# Patient Record
Sex: Female | Born: 1960
Health system: Southern US, Community
[De-identification: ages and names within clinical notes are randomized; demographics above are authoritative.]

## PROBLEM LIST (undated history)

## (undated) DIAGNOSIS — K760 Fatty (change of) liver, not elsewhere classified: Secondary | ICD-10-CM

## (undated) DIAGNOSIS — J189 Pneumonia, unspecified organism: Secondary | ICD-10-CM

## (undated) DIAGNOSIS — K589 Irritable bowel syndrome without diarrhea: Secondary | ICD-10-CM

## (undated) DIAGNOSIS — K8020 Calculus of gallbladder without cholecystitis without obstruction: Secondary | ICD-10-CM

## (undated) DIAGNOSIS — Z8619 Personal history of other infectious and parasitic diseases: Secondary | ICD-10-CM

## (undated) DIAGNOSIS — E119 Type 2 diabetes mellitus without complications: Secondary | ICD-10-CM

## (undated) DIAGNOSIS — K219 Gastro-esophageal reflux disease without esophagitis: Secondary | ICD-10-CM

## (undated) DIAGNOSIS — T7840XA Allergy, unspecified, initial encounter: Secondary | ICD-10-CM

## (undated) DIAGNOSIS — I10 Essential (primary) hypertension: Secondary | ICD-10-CM

## (undated) DIAGNOSIS — F419 Anxiety disorder, unspecified: Secondary | ICD-10-CM

## (undated) DIAGNOSIS — E785 Hyperlipidemia, unspecified: Secondary | ICD-10-CM

## (undated) DIAGNOSIS — N814 Uterovaginal prolapse, unspecified: Secondary | ICD-10-CM

## (undated) DIAGNOSIS — M1990 Unspecified osteoarthritis, unspecified site: Secondary | ICD-10-CM

## (undated) HISTORY — DX: Hyperlipidemia, unspecified: E78.5

## (undated) HISTORY — DX: Gastro-esophageal reflux disease without esophagitis: K21.9

## (undated) HISTORY — DX: Type 2 diabetes mellitus without complications: E11.9

## (undated) HISTORY — DX: Personal history of other infectious and parasitic diseases: Z86.19

## (undated) HISTORY — PX: ABDOMINAL HYSTERECTOMY: SHX81

## (undated) HISTORY — DX: Essential (primary) hypertension: I10

## (undated) HISTORY — DX: Irritable bowel syndrome, unspecified: K58.9

## (undated) HISTORY — PX: COLONOSCOPY: SHX174

## (undated) HISTORY — PX: CHOLECYSTECTOMY: SHX55

## (undated) HISTORY — DX: Fatty (change of) liver, not elsewhere classified: K76.0

## (undated) HISTORY — DX: Calculus of gallbladder without cholecystitis without obstruction: K80.20

## (undated) HISTORY — DX: Anxiety disorder, unspecified: F41.9

## (undated) HISTORY — DX: Uterovaginal prolapse, unspecified: N81.4

## (undated) HISTORY — DX: Allergy, unspecified, initial encounter: T78.40XA

## (undated) HISTORY — DX: Unspecified osteoarthritis, unspecified site: M19.90

## (undated) HISTORY — PX: BREAST SURGERY: SHX581

## (undated) HISTORY — DX: Pneumonia, unspecified organism: J18.9

---
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
## 2018-07-17 LAB — LIPID PANEL
Cholesterol: 178 (ref 0–200)
HDL: 46 (ref 35–70)
LDL Cholesterol: 104
Triglycerides: 167 — AB (ref 40–160)

## 2018-07-17 LAB — COMPREHENSIVE METABOLIC PANEL
Albumin: 4.2 (ref 3.5–5.0)
Calcium: 9.5 (ref 8.7–10.7)
GFR calc non Af Amer: 100
Globulin: 2.7

## 2018-07-17 LAB — HEMOGLOBIN A1C: Hemoglobin A1C: 10.8

## 2018-07-17 LAB — BASIC METABOLIC PANEL
BUN: 9 (ref 4–21)
CO2: 27 — AB (ref 13–22)
Chloride: 103 (ref 99–108)
Creatinine: 0.6 (ref 0.5–1.1)
Glucose: 152
Potassium: 4.5 (ref 3.4–5.3)
Sodium: 139 (ref 137–147)

## 2018-07-17 LAB — CBC AND DIFFERENTIAL
HCT: 39 (ref 36–46)
Hemoglobin: 13.5 (ref 12.0–16.0)
Neutrophils Absolute: 3873
Platelets: 230 (ref 150–399)
WBC: 7.7

## 2018-07-17 LAB — HEPATIC FUNCTION PANEL
ALT: 46 — AB (ref 7–35)
AST: 32 (ref 13–35)
Alkaline Phosphatase: 62 (ref 25–125)
Bilirubin, Total: 0.5

## 2018-07-17 LAB — CBC: RBC: 4.36 (ref 3.87–5.11)

## 2019-01-19 ENCOUNTER — Other Ambulatory Visit: Payer: Self-pay | Admitting: Obstetrics and Gynecology

## 2019-01-19 DIAGNOSIS — N6452 Nipple discharge: Secondary | ICD-10-CM | POA: Diagnosis not present

## 2019-01-19 DIAGNOSIS — Z01419 Encounter for gynecological examination (general) (routine) without abnormal findings: Secondary | ICD-10-CM | POA: Diagnosis not present

## 2019-01-19 DIAGNOSIS — Z6834 Body mass index (BMI) 34.0-34.9, adult: Secondary | ICD-10-CM | POA: Diagnosis not present

## 2019-01-19 LAB — HM PAP SMEAR: HM Pap smear: NEGATIVE

## 2019-01-26 ENCOUNTER — Other Ambulatory Visit: Payer: Self-pay

## 2019-01-26 ENCOUNTER — Ambulatory Visit
Admission: RE | Admit: 2019-01-26 | Discharge: 2019-01-26 | Disposition: A | Discharge status: Home / Self Care | Payer: PRIVATE HEALTH INSURANCE | Source: Ambulatory Visit | Attending: Obstetrics and Gynecology | Admitting: Obstetrics and Gynecology

## 2019-01-26 ENCOUNTER — Other Ambulatory Visit: Payer: Self-pay | Admitting: Obstetrics and Gynecology

## 2019-01-26 ENCOUNTER — Ambulatory Visit
Admission: RE | Admit: 2019-01-26 | Discharge: 2019-01-26 | Disposition: A | Discharge status: Home / Self Care | Payer: Self-pay | Source: Ambulatory Visit | Attending: Obstetrics and Gynecology | Admitting: Obstetrics and Gynecology

## 2019-01-26 DIAGNOSIS — N6452 Nipple discharge: Secondary | ICD-10-CM

## 2019-01-26 LAB — HM MAMMOGRAPHY

## 2019-01-27 DIAGNOSIS — R102 Pelvic and perineal pain: Secondary | ICD-10-CM | POA: Diagnosis not present

## 2019-01-28 ENCOUNTER — Other Ambulatory Visit: Payer: Self-pay

## 2019-01-28 ENCOUNTER — Ambulatory Visit
Admission: RE | Admit: 2019-01-28 | Discharge: 2019-01-28 | Disposition: A | Discharge status: Home / Self Care | Payer: BC Managed Care – PPO | Source: Ambulatory Visit | Attending: Obstetrics and Gynecology | Admitting: Obstetrics and Gynecology

## 2019-01-28 ENCOUNTER — Other Ambulatory Visit (HOSPITAL_COMMUNITY): Payer: Self-pay | Admitting: Diagnostic Radiology

## 2019-01-28 DIAGNOSIS — N6341 Unspecified lump in right breast, subareolar: Secondary | ICD-10-CM | POA: Diagnosis not present

## 2019-01-28 DIAGNOSIS — N6311 Unspecified lump in the right breast, upper outer quadrant: Secondary | ICD-10-CM | POA: Diagnosis not present

## 2019-01-28 DIAGNOSIS — N6452 Nipple discharge: Secondary | ICD-10-CM

## 2019-01-28 DIAGNOSIS — N62 Hypertrophy of breast: Secondary | ICD-10-CM | POA: Diagnosis not present

## 2019-02-08 ENCOUNTER — Encounter: Payer: Self-pay | Admitting: General Surgery

## 2019-02-09 ENCOUNTER — Other Ambulatory Visit: Payer: Self-pay | Admitting: General Surgery

## 2019-02-09 DIAGNOSIS — Z803 Family history of malignant neoplasm of breast: Secondary | ICD-10-CM | POA: Diagnosis not present

## 2019-02-09 DIAGNOSIS — D241 Benign neoplasm of right breast: Secondary | ICD-10-CM

## 2019-02-09 DIAGNOSIS — N6452 Nipple discharge: Secondary | ICD-10-CM | POA: Diagnosis not present

## 2019-02-09 DIAGNOSIS — R739 Hyperglycemia, unspecified: Secondary | ICD-10-CM | POA: Diagnosis not present

## 2019-02-11 ENCOUNTER — Other Ambulatory Visit: Payer: Self-pay | Admitting: General Surgery

## 2019-02-11 DIAGNOSIS — D241 Benign neoplasm of right breast: Secondary | ICD-10-CM

## 2019-03-08 ENCOUNTER — Encounter (HOSPITAL_BASED_OUTPATIENT_CLINIC_OR_DEPARTMENT_OTHER): Payer: Self-pay

## 2019-03-08 ENCOUNTER — Other Ambulatory Visit: Payer: Self-pay

## 2019-03-12 ENCOUNTER — Other Ambulatory Visit (HOSPITAL_COMMUNITY)
Admission: RE | Admit: 2019-03-12 | Discharge: 2019-03-12 | Disposition: A | Discharge status: Home / Self Care | Payer: BC Managed Care – PPO | Source: Ambulatory Visit | Attending: General Surgery | Admitting: General Surgery

## 2019-03-12 DIAGNOSIS — Z01812 Encounter for preprocedural laboratory examination: Secondary | ICD-10-CM | POA: Insufficient documentation

## 2019-03-12 DIAGNOSIS — Z20828 Contact with and (suspected) exposure to other viral communicable diseases: Secondary | ICD-10-CM | POA: Insufficient documentation

## 2019-03-13 LAB — NOVEL CORONAVIRUS, NAA (HOSP ORDER, SEND-OUT TO REF LAB; TAT 18-24 HRS): SARS-CoV-2, NAA: NOT DETECTED

## 2019-03-13 NOTE — H&P (Signed)
[name]: [facility] Patient #: [id number] DOB: 1960/11/02 Married / Language: English / Race: White Female      History of Present Illness      This is a 58 year old female, referred by Dr. Margarette Canada at the [facility] for intraductal papilloma right breast and bloody nipple discharge. Allison Dillon is her gynecologist. She is going to establish with [facility] on [facility] [employer] in November.      She has no prior breast problems. She developed a spontaneous, unilateral right nipple discharge that was bloody about 4-5 weeks ago. Imaging studies showed a small ductal mass on the right side, 11 o'clock position, subareolar area. Axillary ultrasound negative. He was guided biopsy shows papillary lesion and usual ductal hyperplasia. Excision recommended.     Comorbidities include hyperglycemia. She's never been evaluated for this. She does not take any medicines. She says her glucose runs around 200 or a little bit higher. She eats a lot of carbohydrates it sounds like. She no she needs a primary care physician and was going to have to wait about 2 months for that. We talked about this for a long time. I told her to eliminate all carbohydrates from her diet than just eat protein and fruits and vegetables. That will increase the likelihood that her blood sugar is reasonably well controlled at the time of her breast biopsy. Family history reveals  maternal aunt has breast cancer and has metastatic disease. No other cancers in the family Social history reveals she moved to [place] about 2 years ago. Her husband now works as a Sport and exercise psychologist. She is unemployed. Denies alcohol or tobacco. Married with 4 children.      I have advised her to undergo right breast lumpectomy with radioactive seed localization. She agrees with this plan. I did draw pictures for her. I discussed the indications, details, techniques and  numerous risk of the surgery with her. She is aware of the risk of bleeding, infection, cosmetic deformity, reoperation for cancer, flattening of the nipple, insensate nipple. And other unforeseen problems. She understands all these issues. All of her questions were answered. She agrees with this plan.   Allergies  Penicillins  Hives. Iodine (Antiseptic) *ANTISEPTICS & DISINFECTANTS*  Swelling. Iodine Contrast Allergies Reconciled   Medication History Multi Vitamin (Oral) Active. Vitamin D3 (Oral) Specific strength unknown - Active. Vitamin B12 (Oral) Specific strength unknown - Active. Amla Supplement Active. Medications Reconciled  Vitals  Weight: 198 lb Height: 63.75in Body Surface Area: 1.94 m Body Mass Index: 34.25 kg/m  Temp.: 98.27F(Temporal)  Pulse: 120 (Regular)  P.OX: 97% (Room air) BP: 138/80 (Sitting, Left Arm, Standard)     Physical Exam General Mental Status-Alert. General Appearance-Consistent with stated age. Hydration-Well hydrated. Voice-Normal. Note: BMI 34  Head and Neck Head-normocephalic, atraumatic with no lesions or palpable masses. Trachea-midline. Thyroid Gland Characteristics - normal size and consistency.  Eye Eyeball - Bilateral-Extraocular movements intact. Sclera/Conjunctiva - Bilateral-No scleral icterus.  Chest and Lung Exam Chest and lung exam reveals -quiet, even and easy respiratory effort with no use of accessory muscles and on auscultation, normal breath sounds, no adventitious sounds and normal vocal resonance. Inspection Chest Wall - Normal. Back - normal.  Breast Note: Breasts are moderately large. Symmetrical. Both nipples and areola look normal. I cannot elicit a discharge on either side. There is no axillary adenopathy. No hematoma.  Cardiovascular Cardiovascular examination reveals -normal heart sounds, regular rate and rhythm with  no murmurs and normal pedal pulses  bilaterally.  Abdomen Inspection Inspection of the abdomen reveals - No Hernias. Skin - Scar - no surgical scars. Palpation/Percussion Palpation and Percussion of the abdomen reveal - Soft, Non Tender, No Rebound tenderness, No Rigidity (guarding) and No hepatosplenomegaly. Auscultation Auscultation of the abdomen reveals - Bowel sounds normal.  Neurologic Neurologic evaluation reveals -alert and oriented x 3 with no impairment of recent or remote memory. Mental Status-Normal.  Musculoskeletal Normal Exam - Left-Upper Extremity Strength Normal and Lower Extremity Strength Normal. Normal Exam - Right-Upper Extremity Strength Normal and Lower Extremity Strength Normal.  Lymphatic Head & Neck  General Head & Neck Lymphatics: Bilateral - Description - Normal. Axillary  General Axillary Region: Bilateral - Description - Normal. Tenderness - Non Tender. Femoral & Inguinal  Generalized Femoral & Inguinal Lymphatics: Bilateral - Description - Normal. Tenderness - Non Tender.    Assessment & Plan INTRADUCTAL PAPILLOMA OF BREAST, RIGHT (D24.1)  You have recently developed a bloody discharge from your right nipple You have an intraductal papilloma that has been biopsied There is no evidence of cancer on the biopsy However, there is a 10% risk that this may be cancer right now Conservative excision of this area is recommended, and you agree  you will be scheduled for right breast lumpectomy with radioactive seed localization Dr. Dalbert Batman explained the indications, techniques, and risks of this surgery in detail Dr. Dalbert Batman has advised you that he is going to retire in January  BLOODY [place] (N64.52) FAMILY HISTORY OF BREAST CANCER (Z80.3) Impression: Maternal aunt. Has metastatic disease. HYPERGLYCEMIA (R73.9) BMI 34.0-34.9,ADULT (Z68.34)    Allison Dillon. Dalbert Batman, M.D., [facility] General and Minimally invasive Surgery Breast  and Colorectal Surgery

## 2019-03-15 ENCOUNTER — Other Ambulatory Visit: Payer: Self-pay

## 2019-03-15 ENCOUNTER — Ambulatory Visit
Admission: RE | Admit: 2019-03-15 | Discharge: 2019-03-15 | Disposition: A | Discharge status: Home / Self Care | Payer: BC Managed Care – PPO | Source: Ambulatory Visit | Attending: General Surgery | Admitting: General Surgery

## 2019-03-15 DIAGNOSIS — D241 Benign neoplasm of right breast: Secondary | ICD-10-CM

## 2019-03-15 DIAGNOSIS — R928 Other abnormal and inconclusive findings on diagnostic imaging of breast: Secondary | ICD-10-CM | POA: Diagnosis not present

## 2019-03-16 ENCOUNTER — Ambulatory Visit (HOSPITAL_BASED_OUTPATIENT_CLINIC_OR_DEPARTMENT_OTHER)
Admission: RE | Admit: 2019-03-16 | Discharge: 2019-03-16 | Disposition: A | Discharge status: Home / Self Care | Payer: BC Managed Care – PPO | Attending: General Surgery | Admitting: General Surgery

## 2019-03-16 ENCOUNTER — Ambulatory Visit (HOSPITAL_BASED_OUTPATIENT_CLINIC_OR_DEPARTMENT_OTHER): Payer: BC Managed Care – PPO | Admitting: Anesthesiology

## 2019-03-16 ENCOUNTER — Ambulatory Visit
Admission: RE | Admit: 2019-03-16 | Discharge: 2019-03-16 | Disposition: A | Discharge status: Home / Self Care | Payer: BC Managed Care – PPO | Source: Ambulatory Visit | Attending: General Surgery | Admitting: General Surgery

## 2019-03-16 ENCOUNTER — Other Ambulatory Visit: Payer: Self-pay

## 2019-03-16 ENCOUNTER — Encounter (HOSPITAL_BASED_OUTPATIENT_CLINIC_OR_DEPARTMENT_OTHER): Payer: Self-pay | Admitting: Anesthesiology

## 2019-03-16 ENCOUNTER — Encounter (HOSPITAL_BASED_OUTPATIENT_CLINIC_OR_DEPARTMENT_OTHER)
Admission: RE | Disposition: A | Discharge status: Home / Self Care | Payer: Self-pay | Source: Home / Self Care | Attending: General Surgery

## 2019-03-16 DIAGNOSIS — R928 Other abnormal and inconclusive findings on diagnostic imaging of breast: Secondary | ICD-10-CM | POA: Diagnosis not present

## 2019-03-16 DIAGNOSIS — D241 Benign neoplasm of right breast: Secondary | ICD-10-CM

## 2019-03-16 DIAGNOSIS — N6489 Other specified disorders of breast: Secondary | ICD-10-CM | POA: Insufficient documentation

## 2019-03-16 DIAGNOSIS — R739 Hyperglycemia, unspecified: Secondary | ICD-10-CM | POA: Diagnosis not present

## 2019-03-16 DIAGNOSIS — Z803 Family history of malignant neoplasm of breast: Secondary | ICD-10-CM | POA: Diagnosis not present

## 2019-03-16 DIAGNOSIS — N6091 Unspecified benign mammary dysplasia of right breast: Secondary | ICD-10-CM | POA: Diagnosis not present

## 2019-03-16 HISTORY — DX: Benign neoplasm of right breast: D24.1

## 2019-03-16 HISTORY — PX: BREAST LUMPECTOMY WITH RADIOACTIVE SEED LOCALIZATION: SHX6424

## 2019-03-16 LAB — GLUCOSE, CAPILLARY
Glucose-Capillary: 185 mg/dL — ABNORMAL HIGH (ref 70–99)
Glucose-Capillary: 184 mg/dL — ABNORMAL HIGH (ref 70–99)

## 2019-03-16 SURGERY — BREAST LUMPECTOMY WITH RADIOACTIVE SEED LOCALIZATION
Anesthesia: General | Site: Breast | Laterality: Right

## 2019-03-16 MED ORDER — CHLORHEXIDINE GLUCONATE CLOTH 2 % EX PADS: 6.0000 | MEDICATED_PAD | Freq: Once | CUTANEOUS | Status: DC

## 2019-03-16 MED ORDER — PROPOFOL 10 MG/ML IV BOLUS
INTRAVENOUS | Status: AC
  Filled 2019-03-16: qty 20

## 2019-03-16 MED ORDER — CELECOXIB 200 MG PO CAPS
ORAL_CAPSULE | ORAL | Status: AC
  Filled 2019-03-16: qty 1

## 2019-03-16 MED ORDER — EPHEDRINE 5 MG/ML INJ
INTRAVENOUS | Status: AC
  Filled 2019-03-16: qty 10

## 2019-03-16 MED ORDER — PROPOFOL 10 MG/ML IV BOLUS
INTRAVENOUS | Status: DC | PRN
  Administered 2019-03-16: 180 mg via INTRAVENOUS

## 2019-03-16 MED ORDER — EPHEDRINE SULFATE 50 MG/ML IJ SOLN
INTRAMUSCULAR | Status: DC | PRN
  Administered 2019-03-16: 50 mg via INTRAVENOUS

## 2019-03-16 MED ORDER — DEXAMETHASONE SODIUM PHOSPHATE 4 MG/ML IJ SOLN
INTRAMUSCULAR | Status: DC | PRN
  Administered 2019-03-16: 10 mg via INTRAVENOUS

## 2019-03-16 MED ORDER — OXYCODONE HCL 5 MG PO TABS: 5.0000 mg | ORAL_TABLET | Freq: Once | ORAL | Status: DC | PRN

## 2019-03-16 MED ORDER — MIDAZOLAM HCL 5 MG/5ML IJ SOLN
INTRAMUSCULAR | Status: DC | PRN
  Administered 2019-03-16: 2 mg via INTRAVENOUS

## 2019-03-16 MED ORDER — FENTANYL CITRATE (PF) 100 MCG/2ML IJ SOLN
INTRAMUSCULAR | Status: DC | PRN
  Administered 2019-03-16: 100 ug via INTRAVENOUS

## 2019-03-16 MED ORDER — GABAPENTIN 300 MG PO CAPS
ORAL_CAPSULE | ORAL | Status: AC
  Filled 2019-03-16: qty 1

## 2019-03-16 MED ORDER — PROMETHAZINE HCL 6.25 MG/5ML PO SYRP: 6.2500 mg | ORAL_SOLUTION | Freq: Once | ORAL | Status: DC

## 2019-03-16 MED ORDER — CEFAZOLIN SODIUM-DEXTROSE 2-4 GM/100ML-% IV SOLN
INTRAVENOUS | Status: AC
  Filled 2019-03-16: qty 100

## 2019-03-16 MED ORDER — ONDANSETRON HCL 4 MG/2ML IJ SOLN
INTRAMUSCULAR | Status: AC
  Filled 2019-03-16: qty 2

## 2019-03-16 MED ORDER — SODIUM CHLORIDE (PF) 0.9 % IJ SOLN
INTRAMUSCULAR | Status: AC
  Filled 2019-03-16: qty 10

## 2019-03-16 MED ORDER — ACETAMINOPHEN 650 MG RE SUPP: 650.0000 mg | RECTAL | Status: DC | PRN

## 2019-03-16 MED ORDER — FENTANYL CITRATE (PF) 100 MCG/2ML IJ SOLN: 25.0000 ug | INTRAMUSCULAR | Status: DC | PRN

## 2019-03-16 MED ORDER — GABAPENTIN 300 MG PO CAPS
300.0000 mg | ORAL_CAPSULE | ORAL | Status: AC
  Administered 2019-03-16: 08:00:00 300 mg via ORAL

## 2019-03-16 MED ORDER — PROMETHAZINE HCL 25 MG/ML IJ SOLN
6.2500 mg | Freq: Once | INTRAMUSCULAR | Status: AC
  Administered 2019-03-16: 6.25 mg via INTRAVENOUS

## 2019-03-16 MED ORDER — SODIUM CHLORIDE 0.9 % IV SOLN: 250.0000 mL | INTRAVENOUS | Status: DC | PRN

## 2019-03-16 MED ORDER — SODIUM CHLORIDE 0.9% FLUSH: 3.0000 mL | INTRAVENOUS | Status: DC | PRN

## 2019-03-16 MED ORDER — ACETAMINOPHEN 325 MG PO TABS: 650.0000 mg | ORAL_TABLET | ORAL | Status: DC | PRN

## 2019-03-16 MED ORDER — LACTATED RINGERS IV SOLN
INTRAVENOUS | Status: DC
  Administered 2019-03-16 (×2): via INTRAVENOUS

## 2019-03-16 MED ORDER — LIDOCAINE 2% (20 MG/ML) 5 ML SYRINGE
INTRAMUSCULAR | Status: AC
  Filled 2019-03-16: qty 5

## 2019-03-16 MED ORDER — OXYCODONE HCL 5 MG/5ML PO SOLN: 5.0000 mg | Freq: Once | ORAL | Status: DC | PRN

## 2019-03-16 MED ORDER — SODIUM CHLORIDE 0.9% FLUSH: 3.0000 mL | Freq: Two times a day (BID) | INTRAVENOUS | Status: DC

## 2019-03-16 MED ORDER — FENTANYL CITRATE (PF) 100 MCG/2ML IJ SOLN
INTRAMUSCULAR | Status: AC
  Filled 2019-03-16: qty 2

## 2019-03-16 MED ORDER — BUPIVACAINE HCL (PF) 0.5 % IJ SOLN
INTRAMUSCULAR | Status: AC
  Filled 2019-03-16: qty 90

## 2019-03-16 MED ORDER — PROMETHAZINE HCL 25 MG/ML IJ SOLN
INTRAMUSCULAR | Status: AC
  Filled 2019-03-16: qty 1

## 2019-03-16 MED ORDER — DEXAMETHASONE SODIUM PHOSPHATE 10 MG/ML IJ SOLN
INTRAMUSCULAR | Status: AC
  Filled 2019-03-16: qty 1

## 2019-03-16 MED ORDER — OXYCODONE HCL 5 MG PO TABS: 5.0000 mg | ORAL_TABLET | ORAL | Status: DC | PRN

## 2019-03-16 MED ORDER — LIDOCAINE HCL (CARDIAC) PF 100 MG/5ML IV SOSY
PREFILLED_SYRINGE | INTRAVENOUS | Status: DC | PRN
  Administered 2019-03-16: 75 mg via INTRAVENOUS

## 2019-03-16 MED ORDER — MIDAZOLAM HCL 2 MG/2ML IJ SOLN
INTRAMUSCULAR | Status: AC
  Filled 2019-03-16: qty 2

## 2019-03-16 MED ORDER — GLYCOPYRROLATE 0.2 MG/ML IJ SOLN
INTRAMUSCULAR | Status: DC | PRN
  Administered 2019-03-16: 0.1 mg via INTRAVENOUS

## 2019-03-16 MED ORDER — CEFAZOLIN SODIUM-DEXTROSE 2-4 GM/100ML-% IV SOLN
2.0000 g | INTRAVENOUS | Status: AC
  Administered 2019-03-16: 2 g via INTRAVENOUS

## 2019-03-16 MED ORDER — FENTANYL CITRATE (PF) 100 MCG/2ML IJ SOLN
25.0000 ug | INTRAMUSCULAR | Status: DC | PRN
  Administered 2019-03-16 (×2): 25 ug via INTRAVENOUS

## 2019-03-16 MED ORDER — GLYCOPYRROLATE PF 0.2 MG/ML IJ SOSY
PREFILLED_SYRINGE | INTRAMUSCULAR | Status: AC
  Filled 2019-03-16: qty 1

## 2019-03-16 MED ORDER — ONDANSETRON HCL 4 MG/2ML IJ SOLN: 4.0000 mg | Freq: Once | INTRAMUSCULAR | Status: DC | PRN

## 2019-03-16 MED ORDER — LACTATED RINGERS IV SOLN: INTRAVENOUS | Status: DC

## 2019-03-16 MED ORDER — ACETAMINOPHEN 500 MG PO TABS
ORAL_TABLET | ORAL | Status: AC
  Filled 2019-03-16: qty 2

## 2019-03-16 MED ORDER — BUPIVACAINE HCL (PF) 0.5 % IJ SOLN
INTRAMUSCULAR | Status: DC | PRN
  Administered 2019-03-16: 10 mL

## 2019-03-16 MED ORDER — SCOPOLAMINE 1 MG/3DAYS TD PT72
MEDICATED_PATCH | TRANSDERMAL | Status: AC
  Filled 2019-03-16: qty 1

## 2019-03-16 MED ORDER — SCOPOLAMINE 1 MG/3DAYS TD PT72
1.0000 | MEDICATED_PATCH | TRANSDERMAL | Status: DC
  Administered 2019-03-16: 11:00:00 1.5 mg via TRANSDERMAL

## 2019-03-16 MED ORDER — PHENYLEPHRINE HCL (PRESSORS) 10 MG/ML IV SOLN
INTRAVENOUS | Status: DC | PRN
  Administered 2019-03-16: 80 ug via INTRAVENOUS

## 2019-03-16 MED ORDER — ACETAMINOPHEN 500 MG PO TABS
1000.0000 mg | ORAL_TABLET | ORAL | Status: AC
  Administered 2019-03-16: 08:00:00 1000 mg via ORAL

## 2019-03-16 MED ORDER — HYDROCODONE-ACETAMINOPHEN 5-325 MG PO TABS: 1.0000 | ORAL_TABLET | Freq: Four times a day (QID) | ORAL | 0 refills | Status: DC | PRN

## 2019-03-16 MED ORDER — PHENYLEPHRINE 40 MCG/ML (10ML) SYRINGE FOR IV PUSH (FOR BLOOD PRESSURE SUPPORT)
PREFILLED_SYRINGE | INTRAVENOUS | Status: AC
  Filled 2019-03-16: qty 10

## 2019-03-16 MED ORDER — CELECOXIB 200 MG PO CAPS
200.0000 mg | ORAL_CAPSULE | ORAL | Status: AC
  Administered 2019-03-16: 200 mg via ORAL

## 2019-03-16 SURGICAL SUPPLY — 64 items
APPLIER CLIP 9.375 MED OPEN (MISCELLANEOUS) ×3
BENZOIN TINCTURE PRP APPL 2/3 (GAUZE/BANDAGES/DRESSINGS) IMPLANT
BINDER BREAST LRG (GAUZE/BANDAGES/DRESSINGS) IMPLANT
BINDER BREAST MEDIUM (GAUZE/BANDAGES/DRESSINGS) IMPLANT
BINDER BREAST XLRG (GAUZE/BANDAGES/DRESSINGS) IMPLANT
BINDER BREAST XXLRG (GAUZE/BANDAGES/DRESSINGS) ×2 IMPLANT
BLADE HEX COATED 2.75 (ELECTRODE) ×3 IMPLANT
BLADE SURG 10 STRL SS (BLADE) IMPLANT
BLADE SURG 15 STRL LF DISP TIS (BLADE) ×1 IMPLANT
BLADE SURG 15 STRL SS (BLADE) ×2
CANISTER SUC SOCK COL 7IN (MISCELLANEOUS) IMPLANT
CANISTER SUCT 1200ML W/VALVE (MISCELLANEOUS) ×3 IMPLANT
CHLORAPREP W/TINT 26 (MISCELLANEOUS) ×3 IMPLANT
CLIP APPLIE 9.375 MED OPEN (MISCELLANEOUS) IMPLANT
CLOSURE WOUND 1/2 X4 (GAUZE/BANDAGES/DRESSINGS)
COVER BACK TABLE REUSABLE LG (DRAPES) ×3 IMPLANT
COVER MAYO STAND REUSABLE (DRAPES) ×3 IMPLANT
COVER PROBE W GEL 5X96 (DRAPES) ×3 IMPLANT
COVER WAND RF STERILE (DRAPES) IMPLANT
DECANTER SPIKE VIAL GLASS SM (MISCELLANEOUS) IMPLANT
DERMABOND ADVANCED (GAUZE/BANDAGES/DRESSINGS) ×2
DERMABOND ADVANCED .7 DNX12 (GAUZE/BANDAGES/DRESSINGS) ×1 IMPLANT
DRAPE HALF SHEET 70X43 (DRAPES) IMPLANT
DRAPE LAPAROSCOPIC ABDOMINAL (DRAPES) ×3 IMPLANT
DRAPE UTILITY XL STRL (DRAPES) ×3 IMPLANT
DRSG PAD ABDOMINAL 8X10 ST (GAUZE/BANDAGES/DRESSINGS) ×3 IMPLANT
ELECT REM PT RETURN 9FT ADLT (ELECTROSURGICAL) ×3
ELECTRODE REM PT RTRN 9FT ADLT (ELECTROSURGICAL) ×1 IMPLANT
GAUZE SPONGE 4X4 12PLY STRL LF (GAUZE/BANDAGES/DRESSINGS) ×3 IMPLANT
GLOVE BIO SURGEON STRL SZ 6.5 (GLOVE) ×1 IMPLANT
GLOVE BIO SURGEONS STRL SZ 6.5 (GLOVE) ×1
GLOVE EXAM NITRILE MD LF STRL (GLOVE) ×2 IMPLANT
GLOVE SS BIOGEL STRL SZ 7 (GLOVE) ×1 IMPLANT
GLOVE SUPERSENSE BIOGEL SZ 7 (GLOVE) ×2
GOWN STRL REUS W/ TWL LRG LVL3 (GOWN DISPOSABLE) ×1 IMPLANT
GOWN STRL REUS W/ TWL XL LVL3 (GOWN DISPOSABLE) ×1 IMPLANT
GOWN STRL REUS W/TWL LRG LVL3 (GOWN DISPOSABLE) ×2
GOWN STRL REUS W/TWL XL LVL3 (GOWN DISPOSABLE) ×2
ILLUMINATOR WAVEGUIDE N/F (MISCELLANEOUS) IMPLANT
KIT MARKER MARGIN INK (KITS) ×3 IMPLANT
LIGHT WAVEGUIDE WIDE FLAT (MISCELLANEOUS) IMPLANT
NDL HYPO 25X1 1.5 SAFETY (NEEDLE) ×1 IMPLANT
NEEDLE HYPO 25X1 1.5 SAFETY (NEEDLE) ×3 IMPLANT
NS IRRIG 1000ML POUR BTL (IV SOLUTION) ×3 IMPLANT
PACK BASIN DAY SURGERY FS (CUSTOM PROCEDURE TRAY) ×3 IMPLANT
PENCIL BUTTON HOLSTER BLD 10FT (ELECTRODE) ×3 IMPLANT
SLEEVE SCD COMPRESS KNEE MED (MISCELLANEOUS) ×3 IMPLANT
SPONGE LAP 18X18 RF (DISPOSABLE) IMPLANT
SPONGE LAP 4X18 RFD (DISPOSABLE) ×3 IMPLANT
STRIP CLOSURE SKIN 1/2X4 (GAUZE/BANDAGES/DRESSINGS) IMPLANT
SUT ETHILON 3 0 FSL (SUTURE) IMPLANT
SUT MNCRL AB 4-0 PS2 18 (SUTURE) ×3 IMPLANT
SUT SILK 2 0 SH (SUTURE) ×3 IMPLANT
SUT VIC AB 2-0 CT1 27 (SUTURE)
SUT VIC AB 2-0 CT1 TAPERPNT 27 (SUTURE) IMPLANT
SUT VIC AB 3-0 SH 27 (SUTURE)
SUT VIC AB 3-0 SH 27X BRD (SUTURE) IMPLANT
SUT VICRYL 3-0 CR8 SH (SUTURE) ×3 IMPLANT
SYR 10ML LL (SYRINGE) ×3 IMPLANT
TOWEL GREEN STERILE FF (TOWEL DISPOSABLE) ×3 IMPLANT
TRAY FAXITRON CT DISP (TRAY / TRAY PROCEDURE) ×3 IMPLANT
TUBE CONNECTING 20'X1/4 (TUBING) ×1
TUBE CONNECTING 20X1/4 (TUBING) ×2 IMPLANT
YANKAUER SUCT BULB TIP NO VENT (SUCTIONS) ×3 IMPLANT

## 2019-03-16 NOTE — Anesthesia Preprocedure Evaluation (Signed)
Anesthesia Evaluation  Patient identified by MRN, date of birth, ID band Patient awake    Reviewed: Allergy & Precautions, NPO status , Patient's Chart, lab work & pertinent test results  Airway Mallampati: II  TM Distance: >3 FB Neck ROM: Full    Dental  (+) Teeth Intact, Dental Advisory Given   Pulmonary    breath sounds clear to auscultation       Cardiovascular  Rhythm:Regular Rate:Normal     Neuro/Psych    GI/Hepatic   Endo/Other  diabetes  Renal/GU      Musculoskeletal   Abdominal   Peds  Hematology   Anesthesia Other Findings   Reproductive/Obstetrics                             Anesthesia Physical Anesthesia Plan  ASA: II  Anesthesia Plan: General   Post-op Pain Management:    Induction: Intravenous  PONV Risk Score and Plan: Ondansetron and Dexamethasone  Airway Management Planned: LMA  Additional Equipment:   Intra-op Plan:   Post-operative Plan:   Informed Consent: I have reviewed the patients History and Physical, chart, labs and discussed the procedure including the risks, benefits and alternatives for the proposed anesthesia with the patient or authorized representative who has indicated his/her understanding and acceptance.     Dental advisory given  Plan Discussed with: CRNA and Anesthesiologist  Anesthesia Plan Comments:         Anesthesia Quick Evaluation

## 2019-03-16 NOTE — Anesthesia Procedure Notes (Signed)
Procedure Name: LMA Insertion Date/Time: 03/16/2019 8:39 AM Performed by: Marrianne Mood, CRNA Pre-anesthesia Checklist: Patient identified, Emergency Drugs available, Suction available, Patient being monitored and Timeout performed Patient Re-evaluated:Patient Re-evaluated prior to induction Oxygen Delivery Method: Circle system utilized Preoxygenation: Pre-oxygenation with 100% oxygen Induction Type: IV induction Ventilation: Mask ventilation without difficulty LMA: LMA inserted LMA Size: 4.0 Number of attempts: 1 Airway Equipment and Method: Bite block Placement Confirmation: positive ETCO2 Tube secured with: Tape Dental Injury: Teeth and Oropharynx as per pre-operative assessment

## 2019-03-16 NOTE — Op Note (Signed)
Patient Name:           Allison Dillon   Date of Surgery:        03/16/2019  Pre op Diagnosis:      Subareolar papilloma right breast  Post op Diagnosis:    Same  Procedure:                 Right breast lumpectomy with radioactive seed localization  Surgeon:                     [name]. Dalbert Batman, M.D., FACS  Assistant:                      Or staff  Operative Indications:    This is a 58 year old female, referred by Dr. Margarette Canada at the [facility] for intraductal papilloma right breast and bloody nipple discharge. She is brought to the operating room electively for excision.  Marylynn Pearson is her gynecologist.       She has no prior breast problems. She developed a spontaneous, unilateral right nipple discharge that was bloody about 4-5 weeks ago. Imaging studies showed a small ductal mass on the right side, 11 o'clock position, subareolar area. Axillary ultrasound negative. Image guided biopsy shows papillary lesion and usual ductal hyperplasia. Excision recommended. Family history reveals  maternal aunt has breast cancer and has metastatic disease. No other cancers in the family      I have advised her to undergo right breast lumpectomy with radioactive seed localization. She agrees with this plan. I discussed the indications, details, techniques and numerous risk of the surgery with her. She is aware of the risk of bleeding, infection, cosmetic deformity, reoperation for cancer, flattening of the nipple, insensate nipple. And other unforeseen problems. She understands all these issues. All of her questions were answered. She agrees with this plan.  Operative Findings:       The marker clip and radioactive seed were very close to 1 another.  They were almost directly behind the nipple, only slightly at the 11 o'clock position.  I was able to excise this through a hidden scar technique, subareolar incision, upper outer quadrant.  We lifted the skin and dermis up off of the  subareolar breast tissue in a fairly thin plane.  I suspect that she will have a relatively insensate nipple and flattening of the nipple.  The specimen mammogram looked good containing the marker clip and the seed in the relative center of the specimen.  Procedure in Detail:          Following the induction of general LMA anesthesia the patient's right breast was prepped and draped in a sterile fashion.  Surgical timeout was performed.  Intravenous antibiotics were given.  0.5% Marcaine was used as local infiltration anesthetic.  I isolated the radioactive signal which was almost directly behind the nipple.  It was at least 2.5 cm away from the areolar margin.  I made a curvilinear incision at the areolar margin in the upper outer aspect.  We lifted up the skin and dermis and performed a lumpectomy using the neoprobe and the electrocautery.  The specimen was removed and marked with silk sutures and a 6 color ink kit.  The specimen mammogram looked good as described above.  The specimen was sent to the lab where the seed was retrieved.  Hemostasis was excellent.  The wound was irrigated.  I placed 5 metal marker clips in the walls of  the lumpectomy cavity.  The breast tissues were reapproximated with interrupted 3-0 Vicryl sutures and the skin closed with a running subcuticular 4-0 Monocryl and Dermabond.  Dry bandages and a breast binder were placed.  The patient tolerated the procedure well and was taken to PACU in stable condition.  EBL 10 cc.  Counts correct.  Complications none.    Addendum: I logged onto the PMP aware website and reviewed her prescription medication history     Tanishi Nault M. Dalbert Batman, M.D., FACS General and Minimally Invasive Surgery Breast and Colorectal Surgery  03/16/2019 9:27 AM

## 2019-03-16 NOTE — Interval H&P Note (Signed)
History and Physical Interval Note:  03/16/2019 7:36 AM  [name]  has presented today for surgery, with the diagnosis of right breast papillary lesion.  The various methods of treatment have been discussed with the patient and family. After consideration of risks, benefits and other options for treatment, the patient has consented to  Procedure(s): RIGHT BREAST LUMPECTOMY WITH RADIOACTIVE SEED LOCALIZATION (Right) as a surgical intervention.  The patient's history has been reviewed, patient examined, no change in status, stable for surgery.  I have reviewed the patient's chart and labs.  Questions were answered to the patient's satisfaction.     Allison Dillon

## 2019-03-16 NOTE — Transfer of Care (Signed)
Immediate Anesthesia Transfer of Care Note  Patient: Allison Dillon  Procedure(s) Performed: RIGHT BREAST LUMPECTOMY WITH RADIOACTIVE SEED LOCALIZATION (Right Breast)  Patient Location: PACU  Anesthesia Type:General  Level of Consciousness: awake and patient cooperative  Airway & Oxygen Therapy: Patient Spontanous Breathing and Patient connected to face mask oxygen  Post-op Assessment: Report given to RN and Post -op Vital signs reviewed and stable  Post vital signs: Reviewed and stable  Last Vitals:  Vitals Value Taken Time  BP    Temp    Pulse 92 03/16/19 0932  Resp    SpO2 100 % 03/16/19 0932  Vitals shown include unvalidated device data.  Last Pain:  Vitals:   03/16/19 0742  TempSrc: Oral  PainSc: 0-No pain      Patients Stated Pain Goal: 4 ([id number])  Complications: No apparent anesthesia complications

## 2019-03-16 NOTE — Discharge Instructions (Signed)
[place] Office Phone Number [phone]  BREAST BIOPSY/ PARTIAL MASTECTOMY: POST OP INSTRUCTIONS  Always review your discharge instruction sheet given to you by the facility where your surgery was performed.  IF YOU HAVE DISABILITY OR FAMILY LEAVE FORMS, YOU MUST BRING THEM TO THE OFFICE FOR PROCESSING.  DO NOT GIVE THEM TO YOUR DOCTOR.  1. A prescription for pain medication may be given to you upon discharge.  Take your pain medication as prescribed, if needed.  If narcotic pain medicine is not needed, then you may take acetaminophen (Tylenol) or ibuprofen (Advil) as needed. 2. Take your usually prescribed medications unless otherwise directed 3. If you need a refill on your pain medication, please contact your pharmacy.  They will contact our office to request authorization.  Prescriptions will not be filled after 5pm or on week-ends. 4. You should eat very light the first 24 hours after surgery, such as soup, crackers, pudding, etc.  Resume your normal diet the day after surgery. 5. Most patients will experience some swelling and bruising in the breast.  Ice packs and a good support bra will help.  Swelling and bruising can take several days to resolve.  6. It is common to experience some constipation if taking pain medication after surgery.  Increasing fluid intake and taking a stool softener will usually help or prevent this problem from occurring.  A mild laxative (Milk of Magnesia or Miralax) should be taken according to package directions if there are no bowel movements after 48 hours. 7. Unless discharge instructions indicate otherwise, you may remove your bandages 24-48 hours after surgery, and you may shower at that time.  You may have steri-strips (small skin tapes) in place directly over the incision.  These strips should be left on the skin for 7-10 days.  If your surgeon used skin glue on the incision, you may shower in 24 hours.  The glue will flake off over the  next 2-3 weeks.  Any sutures or staples will be removed at the office during your follow-up visit. 8. ACTIVITIES:  You may resume regular daily activities (gradually increasing) beginning the next day.  Wearing a good support bra or sports bra minimizes pain and swelling.  You may have sexual intercourse when it is comfortable. a. You may drive when you no longer are taking prescription pain medication, you can comfortably wear a seatbelt, and you can safely maneuver your car and apply brakes. b. RETURN TO WORK:  ______________________________________________________________________________________ 9. You should see your doctor in the office for a follow-up appointment approximately two weeks after your surgery.  Your doctors nurse will typically make your follow-up appointment when she calls you with your pathology report.  Expect your pathology report 2-3 business days after your surgery.  You may call to check if you do not hear from [place] after three days. 10. OTHER INSTRUCTIONS: _______________________________________________________________________________________________ _____________________________________________________________________________________________________________________________________ _____________________________________________________________________________________________________________________________________ _____________________________________________________________________________________________________________________________________  WHEN TO CALL YOUR DOCTOR: 1. Fever over 101.0 2. Nausea and/or vomiting. 3. Extreme swelling or bruising. 4. Continued bleeding from incision. 5. Increased pain, redness, or drainage from the incision.  The clinic staff is available to answer your questions during regular business hours.  Please dont hesitate to call and ask to speak to one of the nurses for clinical concerns.  If you have a medical emergency, go to the nearest  emergency room or call 911.  A surgeon from [facility] is always on call at the hospital.  For further questions, please visit [email or web address]  Managing Your Pain After Surgery Without Opioids    Thank you for participating in our program to help patients manage their pain after surgery without opioids. This is part of our effort to provide you with the best care possible, without exposing you or your family to the risk that opioids pose.  What pain can I expect after surgery? You can expect to have some pain after surgery. This is normal. The pain is typically worse the day after surgery, and quickly begins to get better. Many studies have found that many patients are able to manage their pain after surgery with Over-the-Counter (OTC) medications such as Tylenol and Motrin. If you have a condition that does not allow you to take Tylenol or Motrin, notify your surgical team.  How will I manage my pain? The best strategy for controlling your pain after surgery is around the clock pain control with Tylenol (acetaminophen) and Motrin (ibuprofen or Advil). Alternating these medications with each other allows you to maximize your pain control. In addition to Tylenol and Motrin, you can use heating pads or ice packs on your incisions to help reduce your pain.  How will I alternate your regular strength over-the-counter pain medication? You will take a dose of pain medication every three hours. ; Start by taking 650 mg of Tylenol (2 pills of 325 mg) ; 3 hours later take 600 mg of Motrin (3 pills of 200 mg) ; 3 hours after taking the Motrin take 650 mg of Tylenol ; 3 hours after that take 600 mg of Motrin.   - 1 -  See example - if your first dose of Tylenol is at 12:00 PM   12:00 PM Tylenol 650 mg (2 pills of 325 mg)  3:00 PM Motrin 600 mg (3 pills of 200 mg)  6:00 PM Tylenol 650 mg (2 pills of 325 mg)  9:00 PM Motrin 600 mg (3 pills  of 200 mg)  Continue alternating every 3 hours   We recommend that you follow this schedule around-the-clock for at least 3 days after surgery, or until you feel that it is no longer needed. Use the table on the last page of this handout to keep track of the medications you are taking. Important: Do not take more than 3000mg  of Tylenol or 3200mg  of Motrin in a 24-hour period. Do not take ibuprofen/Motrin if you have a history of bleeding stomach ulcers, severe kidney disease, &/or actively taking a blood thinner  What if I still have pain? If you have pain that is not controlled with the over-the-counter pain medications (Tylenol and Motrin or Advil) you might have what we call breakthrough pain. You will receive a prescription for a small amount of an opioid pain medication such as Oxycodone, Tramadol, or Tylenol with Codeine. Use these opioid pills in the first 24 hours after surgery if you have breakthrough pain. Do not take more than 1 pill every 4-6 hours.  If you still have uncontrolled pain after using all opioid pills, don't hesitate to call our staff using the number provided. We will help make sure you are managing your pain in the best way possible, and if necessary, we can provide a prescription for additional pain medication.   Day 1    Time  Name of Medication Number of pills taken  Amount of Acetaminophen  Pain Level   Comments  AM PM       AM PM       AM PM  AM PM       AM PM       AM PM       AM PM       AM PM       Total Daily amount of Acetaminophen Do not take more than  3,000 mg per day      Day 2    Time  Name of Medication Number of pills taken  Amount of Acetaminophen  Pain Level   Comments  AM PM       AM PM       AM PM       AM PM       AM PM       AM PM       AM PM       AM PM       Total Daily amount of Acetaminophen Do not take more than  3,000 mg per day      Day 3    Time  Name of Medication Number of pills taken  Amount  of Acetaminophen  Pain Level   Comments  AM PM       AM PM       AM PM       AM PM          AM PM       AM PM       AM PM       AM PM       Total Daily amount of Acetaminophen Do not take more than  3,000 mg per day      Day 4    Time  Name of Medication Number of pills taken  Amount of Acetaminophen  Pain Level   Comments  AM PM       AM PM       AM PM       AM PM       AM PM       AM PM       AM PM       AM PM       Total Daily amount of Acetaminophen Do not take more than  3,000 mg per day      Day 5    Time  Name of Medication Number of pills taken  Amount of Acetaminophen  Pain Level   Comments  AM PM       AM PM       AM PM       AM PM       AM PM       AM PM       AM PM       AM PM       Total Daily amount of Acetaminophen Do not take more than  3,000 mg per day       Day 6    Time  Name of Medication Number of pills taken  Amount of Acetaminophen  Pain Level  Comments  AM PM       AM PM       AM PM       AM PM       AM PM       AM PM       AM PM       AM PM       Total Daily amount of Acetaminophen Do not take more than  3,000 mg per day      Day 7    Time  Name of Medication Number of pills taken  Amount of Acetaminophen  Pain Level   Comments  AM PM       AM PM       AM PM       AM PM       AM PM       AM PM       AM PM       AM PM       Total Daily amount of Acetaminophen Do not take more than  3,000 mg per day        For additional information about how and where to safely dispose of unused opioid medications - [employer]  Disclaimer: This document contains information and/or instructional materials adapted from [place] for the typical patient with your condition. It does not replace medical advice from your health care provider because your experience may differ from that of the typical patient. Talk to your health care provider if you have any questions about  this document, your condition or your treatment plan. Adapted from [place] OR IBUPROFEN CAN BE TAKEN AT 2PM IF NEEDED  Post Anesthesia Home Care Instructions  Activity: Get plenty of rest for the remainder of the day. A responsible individual must stay with you for 24 hours following the procedure.  For the next 24 hours, DO NOT: -Drive a car -Paediatric nurse -Drink alcoholic beverages -Take any medication unless instructed by your physician -Make any legal decisions or sign important papers.  Meals: Start with liquid foods such as gelatin or soup. Progress to regular foods as tolerated. Avoid greasy, spicy, heavy foods. If nausea and/or vomiting occur, drink only clear liquids until the nausea and/or vomiting subsides. Call your physician if vomiting continues.  Special Instructions/Symptoms: Your throat may feel dry or sore from the anesthesia or the breathing tube placed in your throat during surgery. If this causes discomfort, gargle with warm salt water. The discomfort should disappear within 24 hours.  If you had a scopolamine patch placed behind your ear for the management of post- operative nausea and/or vomiting:  1. The medication in the patch is effective for 72 hours, after which it should be removed.  Wrap patch in a tissue and discard in the trash. Wash hands thoroughly with soap and water. 2. You may remove the patch earlier than 72 hours if you experience unpleasant side effects which may include dry mouth, dizziness or visual disturbances. 3. Avoid touching the patch. Wash your hands with soap and water after contact with the patch.

## 2019-03-17 ENCOUNTER — Encounter (HOSPITAL_BASED_OUTPATIENT_CLINIC_OR_DEPARTMENT_OTHER): Payer: Self-pay | Admitting: General Surgery

## 2019-03-17 NOTE — Anesthesia Postprocedure Evaluation (Signed)
Anesthesia Post Note  Patient: Akasia Bille Searles  Procedure(s) Performed: RIGHT BREAST LUMPECTOMY WITH RADIOACTIVE SEED LOCALIZATION (Right Breast)     Patient location during evaluation: PACU Anesthesia Type: General Level of consciousness: awake and alert Pain management: pain level controlled Vital Signs Assessment: post-procedure vital signs reviewed and stable Respiratory status: spontaneous breathing, nonlabored ventilation, respiratory function stable and patient connected to nasal cannula oxygen Cardiovascular status: blood pressure returned to baseline and stable Postop Assessment: no apparent nausea or vomiting Anesthetic complications: no    Last Vitals:  Vitals:   03/16/19 1100 03/16/19 1115  BP:  (!) 144/89  Pulse: (!) 53 (!) 51  Resp:  18  Temp:  36.5 C  SpO2: 100% 98%    Last Pain:  Vitals:   03/17/19 1000  TempSrc:   PainSc: 2                  Trenia Tennyson COKER

## 2019-03-18 LAB — SURGICAL PATHOLOGY

## 2019-03-29 ENCOUNTER — Other Ambulatory Visit: Payer: Self-pay | Admitting: Family Medicine

## 2019-04-05 DIAGNOSIS — X32XXXA Exposure to sunlight, initial encounter: Secondary | ICD-10-CM | POA: Diagnosis not present

## 2019-04-05 DIAGNOSIS — L821 Other seborrheic keratosis: Secondary | ICD-10-CM | POA: Diagnosis not present

## 2019-04-05 DIAGNOSIS — D225 Melanocytic nevi of trunk: Secondary | ICD-10-CM | POA: Diagnosis not present

## 2019-04-05 DIAGNOSIS — L570 Actinic keratosis: Secondary | ICD-10-CM | POA: Diagnosis not present

## 2019-05-04 LAB — HM DIABETES EYE EXAM

## 2019-08-11 ENCOUNTER — Ambulatory Visit: Payer: Self-pay | Attending: Internal Medicine

## 2019-08-11 DIAGNOSIS — Z23 Encounter for immunization: Secondary | ICD-10-CM

## 2019-08-11 NOTE — Progress Notes (Signed)
   Covid-19 Vaccination Clinic  Name:  Allison Dillon    MRN: [id number] DOB: [birth year]  08/11/2019  [name] was observed post Covid-19 immunization for 30 minutes based on pre-vaccination screening without incident. She was provided with Vaccine Information Sheet and instruction to access the V-Safe system.   [name] was instructed to call 911 with any severe reactions post vaccine: Marland Kitchen Difficulty breathing  . Swelling of face and throat  . A fast heartbeat  . A bad rash all over body  . Dizziness and weakness   Immunizations Administered    Name Date Dose VIS Date Route   Pfizer COVID-19 Vaccine 08/11/2019  9:10 AM 0.3 mL 04/22/2019 Intramuscular   Manufacturer: [employer]   Lot: [id number]   [place]: [id number]

## 2019-09-05 ENCOUNTER — Ambulatory Visit: Payer: Self-pay | Attending: Internal Medicine

## 2019-09-05 DIAGNOSIS — Z23 Encounter for immunization: Secondary | ICD-10-CM

## 2019-09-05 NOTE — Progress Notes (Signed)
   Covid-19 Vaccination Clinic  Name:  Allison Dillon    MRN: [id number] DOB: Feb 23, 1961  09/05/2019  [name] was observed post Covid-19 immunization for 30 minutes based on pre-vaccination screening without incident. She was provided with Vaccine Information Sheet and instruction to access the V-Safe system.   [name] was instructed to call 911 with any severe reactions post vaccine: Marland Kitchen Difficulty breathing  . Swelling of face and throat  . A fast heartbeat  . A bad rash all over body  . Dizziness and weakness   Immunizations Administered    Name Date Dose VIS Date Route   Pfizer COVID-19 Vaccine 09/05/2019  8:50 AM 0.3 mL 07/06/2018 Intramuscular   Manufacturer: [place]   Lot: [id number]   [place]: [id number]

## 2020-01-24 ENCOUNTER — Other Ambulatory Visit: Payer: Self-pay

## 2020-01-24 ENCOUNTER — Encounter: Payer: Self-pay | Admitting: Physician Assistant

## 2020-01-24 ENCOUNTER — Ambulatory Visit (INDEPENDENT_AMBULATORY_CARE_PROVIDER_SITE_OTHER): Payer: BC Managed Care – PPO | Admitting: Physician Assistant

## 2020-01-24 VITALS — BP 140/86 | HR 84 | Temp 98.0°F | Ht 63.25 in | Wt 187.0 lb

## 2020-01-24 DIAGNOSIS — R350 Frequency of micturition: Secondary | ICD-10-CM | POA: Diagnosis not present

## 2020-01-24 DIAGNOSIS — E119 Type 2 diabetes mellitus without complications: Secondary | ICD-10-CM | POA: Diagnosis not present

## 2020-01-24 DIAGNOSIS — R222 Localized swelling, mass and lump, trunk: Secondary | ICD-10-CM

## 2020-01-24 DIAGNOSIS — Z136 Encounter for screening for cardiovascular disorders: Secondary | ICD-10-CM | POA: Diagnosis not present

## 2020-01-24 DIAGNOSIS — R079 Chest pain, unspecified: Secondary | ICD-10-CM

## 2020-01-24 DIAGNOSIS — R300 Dysuria: Secondary | ICD-10-CM

## 2020-01-24 DIAGNOSIS — R109 Unspecified abdominal pain: Secondary | ICD-10-CM | POA: Diagnosis not present

## 2020-01-24 DIAGNOSIS — E785 Hyperlipidemia, unspecified: Secondary | ICD-10-CM

## 2020-01-24 DIAGNOSIS — Z13220 Encounter for screening for lipoid disorders: Secondary | ICD-10-CM | POA: Diagnosis not present

## 2020-01-24 LAB — POCT URINALYSIS DIPSTICK
Bilirubin, UA: NEGATIVE
Blood, UA: NEGATIVE
Glucose, UA: NEGATIVE
Ketones, UA: NEGATIVE
Leukocytes, UA: NEGATIVE
Nitrite, UA: NEGATIVE
Protein, UA: NEGATIVE
Spec Grav, UA: 1.01 (ref 1.010–1.025)
Urobilinogen, UA: 0.2 E.U./dL
pH, UA: 6 (ref 5.0–8.0)

## 2020-01-24 LAB — POCT GLYCOSYLATED HEMOGLOBIN (HGB A1C): Hemoglobin A1C: 9.5 % — AB (ref 4.0–5.6)

## 2020-01-24 MED ORDER — CIPROFLOXACIN HCL 250 MG PO TABS: 250.0000 mg | ORAL_TABLET | Freq: Two times a day (BID) | ORAL | 0 refills | Status: DC

## 2020-01-24 NOTE — Patient Instructions (Addendum)
It was great to see you!  For your diabetes: Start Ozempic 0.25 mg weekly injection after I give you the okay with your lab results Continue healthy eating Will likely also recommend starting cholesterol medication Follow-up in 2 weeks  For your lump on your back: We will schedule an ultrasound and you will be contacted about this  For your chest pain: EKG today We will put in a cardiology referral, you will be contacted about this  For your bladder: Start oral cipro antibiotic I will be in touch with your urine culture results  Let's follow-up in 2 weeks, sooner if you have concerns.  Take care,  Inda Coke PA-C

## 2020-01-24 NOTE — Addendum Note (Signed)
Addended by: Erlene Quan on: 01/24/2020 08:02 PM   Modules accepted: Orders

## 2020-01-24 NOTE — Progress Notes (Signed)
Allison Dillon is a 59 y.o. female is here to establish care and discuss diabetes   I acted as a Education administrator for [employer], PA-C Anselmo Pickler, LPN   History of Present Illness:   Chief Complaint  Patient presents with  . Establish Care  . Diabetes  . Cyst    HPI   Pt is here today to establish care.  Diabetes Last hemoglobin A1c was 10.8, and this was 2 years ago.  She states that since this time she has been trying to work on better diet and exercise.  Back in July her blood sugars were around 344, and she was feeling some fatigue so she started working diligently on her diet.  She lost about 6 pounds since then intentionally.  She states that her blood sugars are now around 161.  She is essentially doing a very low carb, high protein, plant-based diet.  She was diagnosed with diabetes at age 1.  At that time she was started on Metformin and she had significant GI distress with this.  Dysuria Patient reports UTI symptoms.  Started about 5 days ago and she is having frequent urination and low back pain.  She states she has a history of urinary tract infections and she has been trying to drink more water.  She does feel like her blood sugars are more elevated due to her symptoms.  She has not tried anything for symptoms other than cranberry juice.  She denies: Fever, chills, nausea, malaise.  Hyperlipidemia Patient reports that she has been diagnosed with hyperlipidemia in the past but is never been on medications.  Last LDL was done 2 years ago and was 104.  Lump Pt c/o lump in her left flank area x yr and is getting bigger. Pt is also having some discomfort dull ache the past week.  This is never been worked up before.  Chest pain Patient reports that she has been having intermittent episodes of chest pain.  She states that prior to her cleaning up her diet recently this was happening frequently, and since she started changing her diet she has had no episodes  since. Felt like a pressure sensation on the left side of her chest.  She felt like her heart was beating irregularly.   This would happen multiple times a day without specific triggers and would last for few minutes. Symptoms never got worse with activity.   Health Maintenance Due  Topic Date Due  . Hepatitis C Screening  Never done  . PNEUMOCOCCAL POLYSACCHARIDE VACCINE AGE 17-64 HIGH RISK  Never done  . FOOT EXAM  Never done  . OPHTHALMOLOGY EXAM  Never done  . URINE MICROALBUMIN  Never done  . HIV Screening  Never done  . COLONOSCOPY  Never done    Past Medical History:  Diagnosis Date  . Allergy   . Anxiety   . Diabetes mellitus without complication (HCC)    type 2, diet and exercise controlled  . GERD (gastroesophageal reflux disease)   . History of chicken pox   . Hyperlipidemia   . 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 Smoking status: Never Smoker  . Smokeless tobacco: Never Used  Vaping Use  . Vaping Use: Never used  Substance Use Topics  . Alcohol use: Never  . Drug use: Never    Past Surgical History:  Procedure Laterality Date  . ABDOMINAL HYSTERECTOMY    . BREAST  LUMPECTOMY WITH RADIOACTIVE SEED LOCALIZATION Right 03/16/2019   Procedure: RIGHT BREAST LUMPECTOMY WITH RADIOACTIVE SEED LOCALIZATION;  Surgeon: Fanny Skates, MD;  Location: [facility];  Service: General;  Laterality: Right;  . BREAST SURGERY    . CHOLECYSTECTOMY      Family History  Problem Relation Age of Onset  . Breast cancer Paternal Aunt 33    PMHx, SurgHx, SocialHx, FamHx, Medications, and Allergies were reviewed in the Visit Navigator and updated as appropriate.   Patient Active Problem List   Diagnosis Date Noted  . Diabetes mellitus without complication ([place])   . 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 Smoking status: Never Smoker  . Smokeless tobacco: Never Used   Vaping Use  . Vaping Use: Never used  Substance Use Topics  . Alcohol use: Never  . Drug use: Never    Current Medications and Allergies:    Current Outpatient Medications:  .  cholecalciferol (VITAMIN D3) 25 MCG (1000 UT) tablet, Take 1,000 Units by mouth daily., Disp: , Rfl:  .  Cyanocobalamin (VITAMIN B-12) 2500 MCG SUBL, Place 1 tablet under the tongue daily., Disp: , Rfl:  .  OVER THE COUNTER MEDICATION, as needed. [place] powder 1000 mcg, it is a drink to bring blood sugar down, Disp: , Rfl:  .  ciprofloxacin (CIPRO) 250 MG tablet, Take 1 tablet (250 mg total) by mouth 2 (two) times daily., Disp: 6 tablet, Rfl: 0   Allergies  Allergen Reactions  . Contrast Media [Iodinated Diagnostic Agents] Anaphylaxis  . Penicillins Hives  . Iodine     Review of Systems   ROS  Negative unless otherwise specified per HPI.  Vitals:   Vitals:   01/24/20 1049  BP: 140/86  Pulse: 84  Temp: 98 F (36.7 C)  TempSrc: Temporal  SpO2: 98%  Weight: 187 lb (84.8 kg)  Height: 5' 3.25" (1.607 m)     Body mass index is 32.86 kg/m.   Physical Exam:    Physical Exam Vitals and nursing note reviewed.  Constitutional:      General: She is not in acute distress.    Appearance: She is well-developed. She is not ill-appearing or toxic-appearing.  Cardiovascular:     Rate and Rhythm: Normal rate and regular rhythm.     Pulses: Normal pulses.     Heart sounds: Normal heart sounds, S1 normal and S2 normal.     Comments: No LE edema Pulmonary:     Effort: Pulmonary effort is normal.     Breath sounds: Normal breath sounds.  Musculoskeletal:     Comments: Palpable mass in left lower lumbar region  Skin:    General: Skin is warm and dry.  Neurological:     Mental Status: She is alert.     GCS: GCS eye subscore is 4. GCS verbal subscore is 5. GCS motor subscore is 6.  Psychiatric:        Speech: Speech normal.        Behavior: Behavior normal. Behavior is cooperative.    Results for  orders placed or performed in visit on 01/24/20  POCT urinalysis dipstick  Result Value Ref Range   Color, UA yellow    Clarity, UA clear    Glucose, UA Negative Negative   Bilirubin, UA Negative    Ketones, UA Negative    Spec Grav, UA 1.010 1.010 - 1.025   Blood, UA Negative    pH, UA  6.0 5.0 - 8.0   Protein, UA Negative Negative   Urobilinogen, UA 0.2 0.2 or 1.0 E.U./dL   Nitrite, UA Negative    Leukocytes, UA Negative Negative   Appearance     Odor    POCT glycosylated hemoglobin (Hb A1C)  Result Value Ref Range   Hemoglobin A1C 9.5 (A) 4.0 - 5.6 %      Assessment and Plan:    Allison Dillon was seen today for establish care, diabetes and cyst.  Diagnoses and all orders for this visit:  Diabetes mellitus without complication ([place]) Hemoglobin A1c is 9.5. I recommended that we start 0.25 mg Ozempic weekly.  And then follow-up with me in 2 weeks. Sample was provided. We also can update her lipid panel and likely start statin. Continue to work on [employer]. -     CBC with Differential/Platelet; Future -     Comprehensive metabolic panel; Future -     POCT glycosylated hemoglobin (Hb A1C) -     Comprehensive metabolic panel -     CBC with Differential/Platelet  Dysuria UA was negative but urine culture is pending. Given duration of symptoms we will go ahead and empirically treat with Cipro 250 mg twice daily x3 days per orders. We will reach out to with culture results. -     POCT urinalysis dipstick -     Urine Culture; Future -     Urine Culture  Hyperlipidemia, unspecified hyperlipidemia type Update lipid panel today and recalculate ASCVD. Likely needs statin. -     Lipid panel; Future -     Lipid panel  Chest pain, unspecified type EKG tracing is personally reviewed.  EKG notes NSR.  No acute changes.  Cardiology referral. -     EKG 12-Lead  Lump of skin of back Possible lipoma. Will order ultrasound for further evaluation. -     US Renal; Future  Other  orders -     ciprofloxacin (CIPRO) 250 MG tablet; Take 1 tablet (250 mg total) by mouth 2 (two) times daily.   CMA or LPN served as scribe during this visit. History, Physical, and Plan performed by medical provider. The above documentation has been reviewed and is accurate and complete.  I recommended the patient follow-up with me in 2 weeks to review further medical issues.  Inda Coke, PA-C [place], [place] 01/24/2020  Follow-up: No follow-ups on file.

## 2020-01-25 LAB — CBC WITH DIFFERENTIAL/PLATELET
Absolute Monocytes: 690 cells/uL (ref 200–950)
Basophils Absolute: 83 cells/uL (ref 0–200)
Basophils Relative: 0.9 %
Eosinophils Absolute: 212 cells/uL (ref 15–500)
Eosinophils Relative: 2.3 %
HCT: 42.3 % (ref 35.0–45.0)
Hemoglobin: 14.2 g/dL (ref 11.7–15.5)
Lymphs Abs: 3818 cells/uL (ref 850–3900)
MCH: 30.7 pg (ref 27.0–33.0)
MCHC: 33.6 g/dL (ref 32.0–36.0)
MCV: 91.6 fL (ref 80.0–100.0)
MPV: 11.3 fL (ref 7.5–12.5)
Monocytes Relative: 7.5 %
Neutro Abs: 4398 cells/uL (ref 1500–7800)
Neutrophils Relative %: 47.8 %
Platelets: 226 10*3/uL (ref 140–400)
RBC: 4.62 10*6/uL (ref 3.80–5.10)
RDW: 12.4 % (ref 11.0–15.0)
Total Lymphocyte: 41.5 %
WBC: 9.2 10*3/uL (ref 3.8–10.8)

## 2020-01-25 LAB — COMPREHENSIVE METABOLIC PANEL
AG Ratio: 1.5 (calc) (ref 1.0–2.5)
ALT: 39 U/L — ABNORMAL HIGH (ref 6–29)
AST: 32 U/L (ref 10–35)
Albumin: 4.6 g/dL (ref 3.6–5.1)
Alkaline phosphatase (APISO): 70 U/L (ref 37–153)
BUN: 10 mg/dL (ref 7–25)
CO2: 29 mmol/L (ref 20–32)
Calcium: 10.2 mg/dL (ref 8.6–10.4)
Chloride: 100 mmol/L (ref 98–110)
Creat: 0.7 mg/dL (ref 0.50–1.05)
Globulin: 3 g/dL (calc) (ref 1.9–3.7)
Glucose, Bld: 159 mg/dL — ABNORMAL HIGH (ref 65–99)
Potassium: 4.2 mmol/L (ref 3.5–5.3)
Sodium: 139 mmol/L (ref 135–146)
Total Bilirubin: 0.4 mg/dL (ref 0.2–1.2)
Total Protein: 7.6 g/dL (ref 6.1–8.1)

## 2020-01-25 LAB — LIPID PANEL
Cholesterol: 194 mg/dL (ref <200)
HDL: 50 mg/dL (ref >=50)
LDL Cholesterol (Calc): 111 mg/dL (calc) — ABNORMAL HIGH
Non-HDL Cholesterol (Calc): 144 mg/dL (calc) — ABNORMAL HIGH (ref <130)
Total CHOL/HDL Ratio: 3.9 (calc) (ref <5.0)
Triglycerides: 211 mg/dL — ABNORMAL HIGH (ref <150)

## 2020-01-25 LAB — URINE CULTURE
MICRO NUMBER:: 10947737
SPECIMEN QUALITY:: ADEQUATE

## 2020-01-26 ENCOUNTER — Other Ambulatory Visit: Payer: Self-pay | Admitting: Physician Assistant

## 2020-01-26 ENCOUNTER — Ambulatory Visit
Admission: RE | Admit: 2020-01-26 | Discharge: 2020-01-26 | Disposition: A | Discharge status: Home / Self Care | Payer: BC Managed Care – PPO | Source: Ambulatory Visit | Attending: Physician Assistant | Admitting: Physician Assistant

## 2020-01-26 DIAGNOSIS — R1900 Intra-abdominal and pelvic swelling, mass and lump, unspecified site: Secondary | ICD-10-CM | POA: Diagnosis not present

## 2020-01-26 DIAGNOSIS — R222 Localized swelling, mass and lump, trunk: Secondary | ICD-10-CM

## 2020-01-30 ENCOUNTER — Other Ambulatory Visit: Payer: Self-pay | Admitting: Physician Assistant

## 2020-01-30 DIAGNOSIS — R222 Localized swelling, mass and lump, trunk: Secondary | ICD-10-CM

## 2020-02-04 ENCOUNTER — Encounter: Payer: Self-pay | Admitting: Physician Assistant

## 2020-02-08 ENCOUNTER — Other Ambulatory Visit: Payer: Self-pay

## 2020-02-08 ENCOUNTER — Encounter: Payer: Self-pay | Admitting: Physician Assistant

## 2020-02-08 ENCOUNTER — Ambulatory Visit (INDEPENDENT_AMBULATORY_CARE_PROVIDER_SITE_OTHER): Payer: BC Managed Care – PPO | Admitting: Physician Assistant

## 2020-02-08 ENCOUNTER — Telehealth: Payer: Self-pay

## 2020-02-08 VITALS — BP 140/90 | HR 63 | Temp 98.1°F | Ht 63.25 in | Wt 186.0 lb

## 2020-02-08 DIAGNOSIS — F419 Anxiety disorder, unspecified: Secondary | ICD-10-CM

## 2020-02-08 DIAGNOSIS — E119 Type 2 diabetes mellitus without complications: Secondary | ICD-10-CM

## 2020-02-08 DIAGNOSIS — R194 Change in bowel habit: Secondary | ICD-10-CM | POA: Diagnosis not present

## 2020-02-08 DIAGNOSIS — K6289 Other specified diseases of anus and rectum: Secondary | ICD-10-CM | POA: Diagnosis not present

## 2020-02-08 MED ORDER — LORAZEPAM 0.5 MG PO TABS: 0.5000 mg | ORAL_TABLET | ORAL | 0 refills | Status: DC | PRN

## 2020-02-08 MED ORDER — TRULICITY 0.75 MG/0.5ML ~~LOC~~ SOAJ: 0.7500 mg | SUBCUTANEOUS | 2 refills | Status: DC

## 2020-02-08 NOTE — Telephone Encounter (Signed)
Patient is calling in to to let Aldona Bar know that Dr.Stephan Eulas Post is her eye doctor, and has an exam scheduled for December.

## 2020-02-08 NOTE — Addendum Note (Signed)
Addended by: Milton Ferguson D on: 02/08/2020 02:34 PM   Modules accepted: Orders

## 2020-02-08 NOTE — Progress Notes (Signed)
Allison Dillon is a 59 y.o. female is here to discuss:  I acted as a Education administrator for [employer], PA-C Anselmo Pickler, LPN  History of Present Illness:   Chief Complaint  Patient presents with  . Diabetes    HPI   Diabetes Pt here for 2 week follow up, last visit 9/14 was started on Ozempic 0.25 mg weekly. Pt did not start medication was concerned with all the side effects. Pt has had some blurred vision and shakiness off and on but she says it is getting better. Pt is checking sugars 140-150 in AM fasting.  Wt Readings from Last 5 Encounters:  02/08/20 186 lb (84.4 kg)  01/24/20 187 lb (84.8 kg)  03/16/19 193 lb 12.6 oz (87.9 kg)   Bowel Changes/Rectal Pain She is concerned that she has a rectal prolapse, has hx of having intense pain in rectum. Has had rectal bleeding in the past associated with hemorrhoids. No family hx of colon cancer/IBD. Needs screening colonoscopy. Would like GI referral. No unintentional weight loss.  Anxiety She has to have an MRI to further examine the mass on her back. She has significant anxiety regarding this.     Health Maintenance Due  Topic Date Due  . Hepatitis C Screening  Never done  . PNEUMOCOCCAL POLYSACCHARIDE VACCINE AGE 66-64 HIGH RISK  Never done  . FOOT EXAM  Never done  . OPHTHALMOLOGY EXAM  Never done  . URINE MICROALBUMIN  Never done  . HIV Screening  Never done  . COLONOSCOPY  Never done  . MAMMOGRAM  01/26/2020    Past Medical History:  Diagnosis Date  . Allergy   . Anxiety   . Diabetes mellitus without complication (HCC)    type 2, diet and exercise controlled  . GERD (gastroesophageal reflux disease)   . History of chicken pox   . Hyperlipidemia   . 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 Smoking status: Never Smoker  . Smokeless tobacco: Never Used  Vaping Use  . Vaping Use: Never used  Substance Use Topics  . Alcohol use: Never  . Drug use: Never     Past Surgical History:  Procedure Laterality Date  . ABDOMINAL HYSTERECTOMY    . BREAST LUMPECTOMY WITH RADIOACTIVE SEED LOCALIZATION Right 03/16/2019   Procedure: RIGHT BREAST LUMPECTOMY WITH RADIOACTIVE SEED LOCALIZATION;  Surgeon: Fanny Skates, MD;  Location: [facility];  Service: General;  Laterality: Right;  . BREAST SURGERY    . CHOLECYSTECTOMY      Family History  Problem Relation Age of Onset  . Breast cancer Paternal Aunt 13  . Arthritis Mother   . Depression Mother   . Diabetes Mother   . Bipolar disorder Mother   . Skin cancer Mother   . Arthritis Father   . Asthma Father   . Diabetes Father   . Hyperlipidemia Father   . Hypertension Father   . Barrett's esophagus Father   . Hyperlipidemia Sister   . Miscarriages / Stillbirths Sister   . Hearing loss Brother   . Prostate cancer Paternal Grandfather   . Depression Son   . Birth defects Son   . Depression Daughter   . Birth defects Daughter   . Asthma Sister   . Arthritis Sister   . Depression Sister   . COPD Sister   . Mental illness Sister   . Suicidality Sister   . Miscarriages / [place]  Sister   . Pulmonary embolism Sister   . Ehlers-Danlos syndrome Sister   . Diabetes Sister   . Asthma Sister   . Arthritis Sister   . Thyroid disease Sister   . Fibromyalgia Sister   . Swallowing difficulties Sister   . Hiatal hernia Sister     PMHx, SurgHx, SocialHx, FamHx, Medications, and Allergies were reviewed in the Visit Navigator and updated as appropriate.   Patient Active Problem List   Diagnosis Date Noted  . Diabetes mellitus without complication ([place])   . 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 Smoking status: Never Smoker  . Smokeless tobacco: Never Used  Vaping Use  . Vaping Use: Never used  Substance Use Topics  . Alcohol use: Never  . Drug use: Never    Current Medications and Allergies:    Current Outpatient  Medications:  .  cholecalciferol (VITAMIN D3) 25 MCG (1000 UT) tablet, Take 1,000 Units by mouth daily., Disp: , Rfl:  .  Cyanocobalamin (VITAMIN B-12) 2500 MCG SUBL, Place 1 tablet under the tongue daily., Disp: , Rfl:  .  OVER THE COUNTER MEDICATION, as needed. [place] powder 1000 mcg, it is a drink to bring blood sugar down, Disp: , Rfl:  .  LORazepam (ATIVAN) 0.5 MG tablet, Take 1 tablet (0.5 mg total) by mouth as needed for sedation., Disp: 10 tablet, Rfl: 0   Allergies  Allergen Reactions  . Contrast Media [Iodinated Diagnostic Agents] Anaphylaxis  . Penicillins Hives  . Iodine     Review of Systems   ROS  Negative unless otherwise specified per HPI.  Vitals:   Vitals:   02/08/20 0859  BP: 140/90  Pulse: 63  Temp: 98.1 F (36.7 C)  TempSrc: Temporal  SpO2: 98%  Weight: 186 lb (84.4 kg)  Height: 5' 3.25" (1.607 m)     Body mass index is 32.69 kg/m.   Physical Exam:    Physical Exam Vitals and nursing note reviewed.  Constitutional:      General: She is not in acute distress.    Appearance: She is well-developed. She is not ill-appearing or toxic-appearing.  Cardiovascular:     Rate and Rhythm: Normal rate and regular rhythm.     Pulses: Normal pulses.     Heart sounds: Normal heart sounds, S1 normal and S2 normal.     Comments: No LE edema Pulmonary:     Effort: Pulmonary effort is normal.     Breath sounds: Normal breath sounds.  Skin:    General: Skin is warm and dry.  Neurological:     Mental Status: She is alert.     GCS: GCS eye subscore is 4. GCS verbal subscore is 5. GCS motor subscore is 6.  Psychiatric:        Speech: Speech normal.        Behavior: Behavior normal. Behavior is cooperative.    Diabetic Foot Exam - Simple   Simple Foot Form Diabetic Foot exam was performed with the following findings: Yes 02/08/2020 10:13 AM  Visual Inspection No deformities, no ulcerations, no other skin breakdown bilaterally: Yes Sensation Testing Intact  to touch and monofilament testing bilaterally: Yes Pulse Check Posterior Tibialis and Dorsalis pulse intact bilaterally: Yes Comments      Assessment and Plan:    Allison Dillon was seen today for diabetes.  Diagnoses and all orders for this visit:  Diabetes mellitus without complication ([place]) Foot exam completed today. Discussed  contraindications of GLP-1s, and side effects. Will start Trulicity (she did not refrigerate her Ozempic so we will discard this.) Provided two week sample and a one-month free coupon. Recommended calling our office to tell us if she would like to continue after about of month. Also asked her to call us with her eye doc so we can request recrods. Update microalbumin/cre urine today. Follow-up in 3 months, sooner if concerns. -     Microalbumin / creatinine urine ratio; Future  Bowel habit changes; Rectal pain GI referral for the above complaints and screening colonoscopy.  Anxiety Ativan for sedation provided for upcoming MRI.  Other orders -     LORazepam (ATIVAN) 0.5 MG tablet; Take 1 tablet (0.5 mg total) by mouth as needed for sedation.   CMA or LPN served as scribe during this visit. History, Physical, and Plan performed by medical provider. The above documentation has been reviewed and is accurate and complete.   Inda Coke, PA-C [place] [place] 02/08/2020  Follow-up: No follow-ups on file.

## 2020-02-08 NOTE — Patient Instructions (Signed)
It was great to see you!  If you want to take an ativan the night before MRI to help you rest, you can.  Take one ativan an hour before and then may take another right before. You may take one the night before as well.  Start 8.48 mg Trulicity weekly. I have given you two pens. I am also giving you a one month coupon, I will send in this dosage for you. Call us after about a month to let us know if you'd like to continue this.  Call us and tell us who did your eye exam.  You will be contacted about your GI referral.  Let's follow-up in 3 months.  Take care,  Inda Coke PA-C

## 2020-02-09 ENCOUNTER — Encounter: Payer: Self-pay | Admitting: Physician Assistant

## 2020-02-09 ENCOUNTER — Telehealth: Payer: Self-pay

## 2020-02-09 LAB — MICROALBUMIN / CREATININE URINE RATIO
Creatinine, Urine: 22 mg/dL (ref 20–275)
Microalb, Ur: <0.2 mg/dL

## 2020-02-09 NOTE — Telephone Encounter (Signed)
Pt forgot to tell Aldona Bar that she is very allergic to the imaging dye.

## 2020-02-09 NOTE — Telephone Encounter (Signed)
Left message on personal voicemail did get message about allergy and we already had it on your chart.

## 2020-02-09 NOTE — Telephone Encounter (Signed)
Noted, chart updated and also received eye exam report.

## 2020-02-14 ENCOUNTER — Telehealth: Payer: Self-pay

## 2020-02-14 DIAGNOSIS — Z1231 Encounter for screening mammogram for malignant neoplasm of breast: Secondary | ICD-10-CM | POA: Diagnosis not present

## 2020-02-14 DIAGNOSIS — Z6832 Body mass index (BMI) 32.0-32.9, adult: Secondary | ICD-10-CM | POA: Diagnosis not present

## 2020-02-14 DIAGNOSIS — Z01419 Encounter for gynecological examination (general) (routine) without abnormal findings: Secondary | ICD-10-CM | POA: Diagnosis not present

## 2020-02-14 LAB — HM MAMMOGRAPHY

## 2020-02-14 MED ORDER — ONETOUCH VERIO VI STRP: ORAL_STRIP | 12 refills | Status: DC

## 2020-02-14 NOTE — Telephone Encounter (Signed)
Medication sent in. 

## 2020-02-14 NOTE — Telephone Encounter (Signed)
Pt is requesting a prescription for more test strips. She has a Production manager

## 2020-02-15 DIAGNOSIS — L570 Actinic keratosis: Secondary | ICD-10-CM | POA: Diagnosis not present

## 2020-02-15 DIAGNOSIS — L821 Other seborrheic keratosis: Secondary | ICD-10-CM | POA: Diagnosis not present

## 2020-02-15 DIAGNOSIS — X32XXXD Exposure to sunlight, subsequent encounter: Secondary | ICD-10-CM | POA: Diagnosis not present

## 2020-02-15 DIAGNOSIS — D225 Melanocytic nevi of trunk: Secondary | ICD-10-CM | POA: Diagnosis not present

## 2020-02-17 DIAGNOSIS — Z8619 Personal history of other infectious and parasitic diseases: Secondary | ICD-10-CM | POA: Insufficient documentation

## 2020-02-17 DIAGNOSIS — E119 Type 2 diabetes mellitus without complications: Secondary | ICD-10-CM | POA: Insufficient documentation

## 2020-02-17 DIAGNOSIS — E785 Hyperlipidemia, unspecified: Secondary | ICD-10-CM | POA: Insufficient documentation

## 2020-02-17 DIAGNOSIS — K219 Gastro-esophageal reflux disease without esophagitis: Secondary | ICD-10-CM | POA: Insufficient documentation

## 2020-02-17 DIAGNOSIS — T7840XA Allergy, unspecified, initial encounter: Secondary | ICD-10-CM | POA: Insufficient documentation

## 2020-02-17 DIAGNOSIS — G43909 Migraine, unspecified, not intractable, without status migrainosus: Secondary | ICD-10-CM

## 2020-02-17 DIAGNOSIS — F419 Anxiety disorder, unspecified: Secondary | ICD-10-CM | POA: Insufficient documentation

## 2020-02-17 HISTORY — DX: Migraine, unspecified, not intractable, without status migrainosus: G43.909

## 2020-02-20 ENCOUNTER — Encounter: Payer: Self-pay | Admitting: Cardiology

## 2020-02-20 ENCOUNTER — Ambulatory Visit (INDEPENDENT_AMBULATORY_CARE_PROVIDER_SITE_OTHER): Payer: BC Managed Care – PPO | Admitting: Cardiology

## 2020-02-20 ENCOUNTER — Other Ambulatory Visit: Payer: Self-pay

## 2020-02-20 VITALS — BP 118/84 | HR 80 | Ht 63.75 in | Wt 184.0 lb

## 2020-02-20 DIAGNOSIS — R0789 Other chest pain: Secondary | ICD-10-CM | POA: Insufficient documentation

## 2020-02-20 DIAGNOSIS — E119 Type 2 diabetes mellitus without complications: Secondary | ICD-10-CM

## 2020-02-20 DIAGNOSIS — E782 Mixed hyperlipidemia: Secondary | ICD-10-CM

## 2020-02-20 DIAGNOSIS — R072 Precordial pain: Secondary | ICD-10-CM | POA: Diagnosis not present

## 2020-02-20 DIAGNOSIS — R011 Cardiac murmur, unspecified: Secondary | ICD-10-CM | POA: Insufficient documentation

## 2020-02-20 HISTORY — DX: Cardiac murmur, unspecified: R01.1

## 2020-02-20 HISTORY — DX: Other chest pain: R07.89

## 2020-02-20 MED ORDER — ASPIRIN EC 81 MG PO TBEC: 81.0000 mg | DELAYED_RELEASE_TABLET | Freq: Every day | ORAL | 3 refills | Status: DC

## 2020-02-20 MED ORDER — ATORVASTATIN CALCIUM 10 MG PO TABS: 10.0000 mg | ORAL_TABLET | Freq: Every day | ORAL | 3 refills | Status: DC

## 2020-02-20 NOTE — Patient Instructions (Addendum)
Medication Instructions:  No medication changes. *If you need a refill on your cardiac medications before your next appointment, please call your pharmacy*   Lab Work: Your physician recommends that you return for lab work in: 6 weeks (04/02/20).  If you have labs (blood work) drawn today and your tests are completely normal, you will receive your results only by: Marland Kitchen MyChart Message (if you have MyChart) OR . A paper copy in the mail If you have any lab test that is abnormal or we need to change your treatment, we will call you to review the results.   Testing/Procedures: Your physician has requested that you have an echocardiogram. Echocardiography is a painless test that uses sound waves to create images of your heart. It provides your doctor with information about the size and shape of your heart and how well your heart's chambers and valves are working. This procedure takes approximately one hour. There are no restrictions for this procedure.  Your physician has requested that you have a lexiscan myoview. For further information please visit [email or web address]. Please follow instruction sheet, as given.  The test will take approximately 3 to 4 hours to complete; you may bring reading material.  If someone comes with you to your appointment, they will need to remain in the main lobby due to limited space in the testing area.  How to prepare for your Myocardial Perfusion Test: . Do not eat or drink 3 hours prior to your test, except you may have water. . Do not consume products containing caffeine (regular or decaffeinated) 12 hours prior to your test. (ex: coffee, chocolate, sodas, tea). . Do bring a list of your current medications with you.  If not listed below, you may take your medications as normal. . Do wear comfortable clothes (no dresses or overalls) and walking shoes, tennis shoes preferred (No heels or open toe shoes are allowed). . Do NOT wear cologne, perfume, aftershave, or  lotions (deodorant is allowed). . If these instructions are not followed, your test will have to be rescheduled.      Follow-Up: At [facility], you and your health needs are our priority.  As part of our continuing mission to provide you with exceptional heart care, we have created designated Provider Care Teams.  These Care Teams include your primary Cardiologist (physician) and Advanced Practice Providers (APPs -  Physician Assistants and Nurse Practitioners) who all work together to provide you with the care you need, when you need it.  We recommend signing up for the patient portal called "MyChart".  Sign up information is provided on this After Visit Summary.  MyChart is used to connect with patients for Virtual Visits (Telemedicine).  Patients are able to view lab/test results, encounter notes, upcoming appointments, etc.  Non-urgent messages can be sent to your provider as well.   To learn more about what you can do with MyChart, go to [email or web address].    Your next appointment:   2 month(s)  The format for your next appointment:   In Person  Provider:   Jyl Heinz, MD   Other Instructions  Cardiac Nuclear Scan A cardiac nuclear scan is a test that is done to check the flow of blood to your heart. It is done when you are resting and when you are exercising. The test looks for problems such as:  Not enough blood reaching a portion of the heart.  The heart muscle not working as it should. You may need this test if:  You  have heart disease.  You have had lab results that are not normal.  You have had heart surgery or a balloon procedure to open up blocked arteries (angioplasty).  You have chest pain.  You have shortness of breath. In this test, a special dye (tracer) is put into your bloodstream. The tracer will travel to your heart. A camera will then take pictures of your heart to see how the tracer moves through your heart. This test is usually done at  a hospital and takes 2-4 hours. Tell a doctor about:  Any allergies you have.  All medicines you are taking, including vitamins, herbs, eye drops, creams, and over-the-counter medicines.  Any problems you or family members have had with anesthetic medicines.  Any blood disorders you have.  Any surgeries you have had.  Any medical conditions you have.  Whether you are pregnant or may be pregnant. What are the risks? Generally, this is a safe test. However, problems may occur, such as:  Serious chest pain and heart attack. This is only a risk if the stress portion of the test is done.  Rapid heartbeat.  A feeling of warmth in your chest. This feeling usually does not last long.  Allergic reaction to the tracer. What happens before the test?  Ask your doctor about changing or stopping your normal medicines. This is important.  Follow instructions from your doctor about what you cannot eat or drink.  Remove your jewelry on the day of the test. What happens during the test?  An IV tube will be inserted into one of your veins.  Your doctor will give you a small amount of tracer through the IV tube.  You will wait for 20-40 minutes while the tracer moves through your bloodstream.  Your heart will be monitored with an electrocardiogram (ECG).  You will lie down on an exam table.  Pictures of your heart will be taken for about 15-20 minutes.  You may also have a stress test. For this test, one of these things may be done: ? You will be asked to exercise on a treadmill or a stationary bike. ? You will be given medicines that will make your heart work harder. This is done if you are unable to exercise.  When blood flow to your heart has peaked, a tracer will again be given through the IV tube.  After 20-40 minutes, you will get back on the exam table. More pictures will be taken of your heart.  Depending on the tracer that is used, more pictures may need to be taken 3-4  hours later.  Your IV tube will be removed when the test is over. The test may vary among doctors and hospitals. What happens after the test?  Ask your doctor: ? Whether you can return to your normal schedule, including diet, activities, and medicines. ? Whether you should drink more fluids. This will help to remove the tracer from your body. Drink enough fluid to keep your pee (urine) pale yellow.  Ask your doctor, or the department that is doing the test: ? When will my results be ready? ? How will I get my results? Summary  A cardiac nuclear scan is a test that is done to check the flow of blood to your heart.  Tell your doctor whether you are pregnant or may be pregnant.  Before the test, ask your doctor about changing or stopping your normal medicines. This is important.  Ask your doctor whether you can return to  your normal activities. You may be asked to drink more fluids. This information is not intended to replace advice given to you by your health care provider. Make sure you discuss any questions you have with your health care provider. Document Revised: 08/18/2018 Document Reviewed: 10/12/2017 Elsevier Patient Education  [place].  Echocardiogram An echocardiogram is a procedure that uses painless sound waves (ultrasound) to produce an image of the heart. Images from an echocardiogram can provide important information about:  Signs of coronary artery disease (CAD).  Aneurysm detection. An aneurysm is a weak or damaged part of an artery wall that bulges out from the normal force of blood pumping through the body.  Heart size and shape. Changes in the size or shape of the heart can be associated with certain conditions, including heart failure, aneurysm, and CAD.  Heart muscle function.  Heart valve function.  Signs of a past heart attack.  Fluid buildup around the heart.  Thickening of the heart muscle.  A tumor or infectious growth around the heart  valves. Tell a health care provider about:  Any allergies you have.  All medicines you are taking, including vitamins, herbs, eye drops, creams, and over-the-counter medicines.  Any blood disorders you have.  Any surgeries you have had.  Any medical conditions you have.  Whether you are pregnant or may be pregnant. What are the risks? Generally, this is a safe procedure. However, problems may occur, including:  Allergic reaction to dye (contrast) that may be used during the procedure. What happens before the procedure? No specific preparation is needed. You may eat and drink normally. What happens during the procedure?   An IV tube may be inserted into one of your veins.  You may receive contrast through this tube. A contrast is an injection that improves the quality of the pictures from your heart.  A gel will be applied to your chest.  A wand-like tool (transducer) will be moved over your chest. The gel will help to transmit the sound waves from the transducer.  The sound waves will harmlessly bounce off of your heart to allow the heart images to be captured in real-time motion. The images will be recorded on a computer. The procedure may vary among health care providers and hospitals. What happens after the procedure?  You may return to your normal, everyday life, including diet, activities, and medicines, unless your health care provider tells you not to do that. Summary  An echocardiogram is a procedure that uses painless sound waves (ultrasound) to produce an image of the heart.  Images from an echocardiogram can provide important information about the size and shape of your heart, heart muscle function, heart valve function, and fluid buildup around your heart.  You do not need to do anything to prepare before this procedure. You may eat and drink normally.  After the echocardiogram is completed, you may return to your normal, everyday life, unless your health care  provider tells you not to do that. This information is not intended to replace advice given to you by your health care provider. Make sure you discuss any questions you have with your health care provider. Document Revised: 08/19/2018 Document Reviewed: 05/31/2016 Elsevier Patient Education  [place].

## 2020-02-20 NOTE — Progress Notes (Signed)
Cardiology Office Note:    Date:  02/20/2020   ID:  [name], DOB 1960/10/20, MRN [id number]  PCP:  Inda Coke, PA  Cardiologist:  Jenean Lindau, MD   Referring MD: Inda Coke, PA    ASSESSMENT:    1. Diabetes mellitus without complication ([place])   2. Mixed hyperlipidemia   3. Chest discomfort   4. Precordial pain    PLAN:    In order of problems listed above:  1. Primary prevention stressed with the patient.  Importance of compliance with diet medication stressed and she vocalized understanding.  She walks half an hour but not regularly.  I told her to continue walking she has no symptoms. 2. Chest discomfort: Atypical however in view of risk factors we will do a Lexiscan sestamibi. 3. Cardiac murmur: Echocardiogram will be done to assess murmur on auscultation. 4. Mixed dyslipidemia: Diet was emphasized.  According to guideline directed medical therapy I will initiate atorvastatin 10 mg daily.  Liver lipid check in 6 weeks. 5. Diabetes mellitus: I told her to follow the instructions of her primary care provider.  I also told her to get established with diabetologist for adequate control.  Her hemoglobin A1c is markedly elevated.  Diet was emphasized. 6. Patient will be seen in follow-up appointment in 2 months or earlier if the patient has any concerns.  She was advised to take a coated aspirin on a daily basis.  81 mg   Medication Adjustments/Labs and Tests Ordered: Current medicines are reviewed at length with the patient today.  Concerns regarding medicines are outlined above.  No orders of the defined types were placed in this encounter.  No orders of the defined types were placed in this encounter.    No chief complaint on file.    History of Present Illness:    Allison Dillon is a 59 y.o. female.  Patient has past medical history of mixed dyslipidemia and diabetes mellitus.  She has not been very compliant and not has been under the  current medical care she has not been taking better care of herself.  She is now moved here from [place] about 2 years ago and wants to settle down and take better care of herself.  I noted a hemoglobin A1c of 12 on her KPN sheet.  She mentions to me that she occasionally has chest discomfort no orthopnea or PND.  She can walk about half an hour without any symptoms.  At the time of my evaluation, the patient is alert awake oriented and in no distress.  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   Cyanocobalamin (VITAMIN B-12) 2500 MCG SUBL Place 1 tablet under the tongue daily.   Dulaglutide (TRULICITY) 8.24 MP/5.3IR SOPN Inject 0.75 mg into the skin once a week.   glucose blood (ONETOUCH VERIO) test strip Use to test blood sugars daily. Dx: E11.9   LORazepam (ATIVAN) 0.5 MG tablet Take 1 tablet (0.5 mg total) by mouth as needed for sedation.   OVER THE COUNTER MEDICATION as needed. [place] powder 1000 mcg, it is a drink to bring blood sugar down     Allergies:   Contrast media [iodinated diagnostic agents], Penicillins, and Iodine    Social History   Socioeconomic History   Marital status: Married    Spouse name: Not on file   Number of children: Not on file   Years of education: Not on file   Highest education level: Not on file  Occupational History   Not on file  Tobacco Use   Smoking status: Never Smoker   Smokeless tobacco: Never Used  Vaping Use   Vaping Use: Never used  Substance and Sexual Activity   Alcohol use: Never   Drug use: Never   Sexual activity: Yes    Birth control/protection: Surgical  Other Topics Concern   Not on file  Social History Narrative   Moved here 3 years ago from [place]   Married   4 grown children   16 grandchildren   Social Determinants of Health   Financial Resource Strain:    Difficulty of Paying Living Expenses: Not on file  Food Insecurity:    Worried About Charity fundraiser in the Last Year: Not on file   [employer] of Food in the Last Year: Not on file  Transportation Needs:    Lack of Transportation (Medical): Not on file   Lack of Transportation (Non-Medical): Not on file  Physical Activity:    Days of Exercise per Week: Not on file   Minutes of Exercise per Session: Not on file  Stress:    Feeling of Stress : Not on file  Social Connections:    Frequency of Communication with Friends and Family: Not on file   Frequency of Social Gatherings with Friends and Family: Not on file   Attends Religious Services: Not on file   Active Member of Clubs or Organizations: Not on file   Attends Archivist Meetings: Not on file   Marital Status: Not on file     Family History: The patient's family history includes Arthritis in her father, mother, sister, and sister; Asthma in her father, sister, and sister; Barrett's esophagus in her father; Bipolar disorder in her mother; Birth defects in her daughter and son; Breast cancer (age of onset: 43) in her paternal aunt; COPD in her sister; Depression in her daughter, mother, sister, and  son; Diabetes in her father, mother, and sister; Ehlers-Danlos syndrome in her sister; Fibromyalgia in her sister; Hearing loss in her brother; Hiatal hernia in her sister; Hyperlipidemia in her father and sister; Hypertension in her father; Mental illness in her sister; Miscarriages / Stillbirths in her sister and sister; Prostate cancer in her paternal grandfather; Pulmonary embolism in her sister; Skin cancer in her mother; Suicidality in her sister; Swallowing difficulties in her sister; Thyroid disease in her sister.  ROS:   Please see the history of present illness.    All other systems reviewed and are negative.  EKGs/Labs/Other Studies Reviewed:    The following studies were reviewed  today: EKG reveals sinus rhythm and nonspecific ST-T changes   Recent Labs: 01/24/2020: ALT 39; BUN 10; Creat 0.70; Hemoglobin 14.2; Platelets 226; Potassium 4.2; Sodium 139  Recent Lipid Panel    Component Value Date/Time   CHOL 194 01/24/2020 1157   TRIG 211 (H) 01/24/2020 1157   HDL 50 01/24/2020 1157   CHOLHDL 3.9 01/24/2020 1157   LDLCALC 111 (H) 01/24/2020 1157    Physical Exam:    VS:  BP 118/84    Pulse 80    Ht 5' 3.75" (1.619 m)    Wt 184 lb (83.5 kg)    SpO2 97%    BMI 31.83 kg/m     Wt Readings from Last 3 Encounters:  02/20/20 184 lb (83.5 kg)  02/08/20 186 lb (84.4 kg)  01/24/20 187 lb (84.8 kg)     GEN: Patient is in no acute distress HEENT: Normal NECK: No JVD; No carotid bruits LYMPHATICS: No lymphadenopathy CARDIAC: Hear sounds regular, 2/6 systolic murmur at the apex. RESPIRATORY:  Clear to auscultation without rales, wheezing or rhonchi  ABDOMEN: Soft, non-tender, non-distended MUSCULOSKELETAL:  No edema; No deformity  SKIN: Warm and dry NEUROLOGIC:  Alert and oriented x 3 PSYCHIATRIC:  Normal affect   Signed, Jenean Lindau, MD  02/20/2020 2:00 PM    [place]

## 2020-02-21 ENCOUNTER — Encounter: Payer: Self-pay | Admitting: Physician Assistant

## 2020-02-22 ENCOUNTER — Ambulatory Visit
Admission: RE | Admit: 2020-02-22 | Discharge: 2020-02-22 | Disposition: A | Discharge status: Home / Self Care | Payer: BC Managed Care – PPO | Source: Ambulatory Visit | Attending: Physician Assistant | Admitting: Physician Assistant

## 2020-02-22 DIAGNOSIS — R222 Localized swelling, mass and lump, trunk: Secondary | ICD-10-CM

## 2020-02-22 DIAGNOSIS — M5126 Other intervertebral disc displacement, lumbar region: Secondary | ICD-10-CM | POA: Diagnosis not present

## 2020-02-22 DIAGNOSIS — M5136 Other intervertebral disc degeneration, lumbar region: Secondary | ICD-10-CM | POA: Diagnosis not present

## 2020-02-22 MED ORDER — GADOBENATE DIMEGLUMINE 529 MG/ML IV SOLN
17.0000 mL | Freq: Once | INTRAVENOUS | Status: AC | PRN
  Administered 2020-02-22: 17 mL via INTRAVENOUS

## 2020-02-23 ENCOUNTER — Telehealth: Payer: Self-pay

## 2020-02-23 NOTE — Telephone Encounter (Signed)
Read the patient her MRI results and advice given by [facility] verbalized understanding and has declined surgery referral for now  she also wanted to give update on Trulicity pt reported it is working well bringing her blood sugar down she was feeling nauseous and tired a first but that has gotten better with time

## 2020-03-01 ENCOUNTER — Telehealth (HOSPITAL_COMMUNITY): Payer: Self-pay | Admitting: *Deleted

## 2020-03-01 NOTE — Telephone Encounter (Signed)
Patient given detailed instructions per Myocardial Perfusion Study Information Sheet for the test on 03/07/20 at 8:00. Patient notified to arrive 15 minutes early and that it is imperative to arrive on time for appointment to keep from having the test rescheduled.  If you need to cancel or reschedule your appointment, please call the office within 24 hours of your appointment. . Patient verbalized understanding.Allison Dillon

## 2020-03-07 ENCOUNTER — Other Ambulatory Visit: Payer: Self-pay

## 2020-03-07 ENCOUNTER — Ambulatory Visit (HOSPITAL_COMMUNITY): Payer: BC Managed Care – PPO | Attending: Cardiovascular Disease

## 2020-03-07 VITALS — Ht 64.0 in | Wt 184.0 lb

## 2020-03-07 DIAGNOSIS — R110 Nausea: Secondary | ICD-10-CM | POA: Diagnosis not present

## 2020-03-07 DIAGNOSIS — E119 Type 2 diabetes mellitus without complications: Secondary | ICD-10-CM | POA: Diagnosis not present

## 2020-03-07 DIAGNOSIS — R072 Precordial pain: Secondary | ICD-10-CM

## 2020-03-07 DIAGNOSIS — R0789 Other chest pain: Secondary | ICD-10-CM | POA: Diagnosis not present

## 2020-03-07 LAB — MYOCARDIAL PERFUSION IMAGING
LV dias vol: 60 mL (ref 46–106)
LV sys vol: 19 mL
Peak HR: 134 {beats}/min
Rest HR: 75 {beats}/min
SDS: 0
SRS: 0
SSS: 0
TID: 1.03

## 2020-03-07 MED ORDER — TECHNETIUM TC 99M TETROFOSMIN IV KIT
31.8000 | PACK | Freq: Once | INTRAVENOUS | Status: AC | PRN
  Administered 2020-03-07: 31.8 via INTRAVENOUS
  Filled 2020-03-07: qty 32

## 2020-03-07 MED ORDER — REGADENOSON 0.4 MG/5ML IV SOLN
0.4000 mg | Freq: Once | INTRAVENOUS | Status: AC
  Administered 2020-03-07: 0.4 mg via INTRAVENOUS

## 2020-03-07 MED ORDER — TECHNETIUM TC 99M TETROFOSMIN IV KIT
10.4000 | PACK | Freq: Once | INTRAVENOUS | Status: AC | PRN
  Administered 2020-03-07: 10.4 via INTRAVENOUS
  Filled 2020-03-07: qty 11

## 2020-03-07 MED ORDER — AMINOPHYLLINE 25 MG/ML IV SOLN
150.0000 mg | Freq: Once | INTRAVENOUS | Status: AC
  Administered 2020-03-07: 150 mg via INTRAVENOUS

## 2020-03-09 ENCOUNTER — Other Ambulatory Visit: Payer: Self-pay

## 2020-03-09 ENCOUNTER — Telehealth: Payer: Self-pay

## 2020-03-09 ENCOUNTER — Telehealth: Payer: Self-pay | Admitting: Cardiology

## 2020-03-09 ENCOUNTER — Ambulatory Visit (HOSPITAL_BASED_OUTPATIENT_CLINIC_OR_DEPARTMENT_OTHER)
Admission: RE | Admit: 2020-03-09 | Discharge: 2020-03-09 | Disposition: A | Discharge status: Home / Self Care | Payer: BC Managed Care – PPO | Source: Ambulatory Visit | Attending: Cardiology | Admitting: Cardiology

## 2020-03-09 DIAGNOSIS — R0789 Other chest pain: Secondary | ICD-10-CM | POA: Diagnosis not present

## 2020-03-09 LAB — ECHOCARDIOGRAM COMPLETE
Area-P 1/2: 2.14 cm2
S' Lateral: 2.54 cm

## 2020-03-09 NOTE — Telephone Encounter (Signed)
Allison Dillon is calling requesting someone call her to discuss her Echo and Lexiscan results before the weekend. Please advise.

## 2020-03-09 NOTE — Telephone Encounter (Signed)
Please see message and advise 

## 2020-03-09 NOTE — Telephone Encounter (Signed)
Patient is calling in this morning, stating that she was put on Trulicity a few weeks ago and ever since being on the medication she has noticed blurry vision and dull sharp pain in her back. Asked if this could be from the medication or if she needs to come in to discuss.

## 2020-03-12 NOTE — Telephone Encounter (Signed)
I would recommend stopping the Trulicity. Schedule an appointment with eye doctor asap (if needs referral, we can put one in for her)  Follow-up with me on the back pain

## 2020-03-12 NOTE — Telephone Encounter (Signed)
Spoke to pt told her Aldona Bar said to I would recommend stopping the Trulicity. Schedule an appointment with eye doctor asap. Pt said she is scheduled but not till the end of Dec but she was going to call them back to be seen soon. Also told to make a Follow-up appt with [facility] to evaluate the back pain. Pt verbalized understanding and said it is off and on will wait and see if needs to will schedule.

## 2020-03-12 NOTE — Telephone Encounter (Signed)
See phone note

## 2020-03-26 DIAGNOSIS — E119 Type 2 diabetes mellitus without complications: Secondary | ICD-10-CM | POA: Diagnosis not present

## 2020-03-26 DIAGNOSIS — H0288A Meibomian gland dysfunction right eye, upper and lower eyelids: Secondary | ICD-10-CM | POA: Diagnosis not present

## 2020-03-26 DIAGNOSIS — H2513 Age-related nuclear cataract, bilateral: Secondary | ICD-10-CM | POA: Diagnosis not present

## 2020-03-26 LAB — HM DIABETES EYE EXAM

## 2020-03-27 ENCOUNTER — Encounter: Payer: Self-pay | Admitting: Physician Assistant

## 2020-03-28 DIAGNOSIS — E782 Mixed hyperlipidemia: Secondary | ICD-10-CM | POA: Diagnosis not present

## 2020-03-29 LAB — HEPATIC FUNCTION PANEL
ALT: 30 IU/L (ref 0–32)
AST: 29 IU/L (ref 0–40)
Albumin: 4.3 g/dL (ref 3.8–4.9)
Alkaline Phosphatase: 87 IU/L (ref 44–121)
Bilirubin Total: 0.4 mg/dL (ref 0.0–1.2)
Bilirubin, Direct: 0.15 mg/dL (ref 0.00–0.40)
Total Protein: 7.2 g/dL (ref 6.0–8.5)

## 2020-03-29 LAB — LIPID PANEL
Chol/HDL Ratio: 3 ratio (ref 0.0–4.4)
Cholesterol, Total: 137 mg/dL (ref 100–199)
HDL: 46 mg/dL (ref >39)
LDL Chol Calc (NIH): 67 mg/dL (ref 0–99)
Triglycerides: 139 mg/dL (ref 0–149)
VLDL Cholesterol Cal: 24 mg/dL (ref 5–40)

## 2020-04-12 ENCOUNTER — Telehealth: Payer: Self-pay

## 2020-04-12 ENCOUNTER — Telehealth: Payer: Self-pay | Admitting: Cardiology

## 2020-04-12 NOTE — Telephone Encounter (Signed)
What about the atorvastatin question?

## 2020-04-12 NOTE — Telephone Encounter (Signed)
Can stop atorvastatin and let us know how patient feels in a week or so.  I made the switch to rosuvastatin depending on how she feels.

## 2020-04-12 NOTE — Telephone Encounter (Signed)
I reviewed her chart.  I do not know what she means by saying her current condition.  It is best that she talk to her primary care doctor.  It is best she is

## 2020-04-12 NOTE — Telephone Encounter (Signed)
Please advise 

## 2020-04-12 NOTE — Telephone Encounter (Signed)
Pt called asking to get advise on the booster shot from [facility] Pt wants to know if it would be safe for her to get it with her underlying conditions. Please advise.

## 2020-04-12 NOTE — Telephone Encounter (Signed)
New Message:     Pt wants to know with her current condition will be it alright for to take the Booster for COVID? Please let her know asap please.    Pt c/o medication issue:  1. Name of Medication:Atorvastatin  2. How are you currently taking this medication (dosage and times per day)? 1 time a day  3. Are you having a reaction (difficulty breathing--STAT)? no  4. What is your medication issue?  Stomach pain and blood sugar is going up- pt would like to stop taking the  Atorvastain

## 2020-04-12 NOTE — Telephone Encounter (Signed)
Please let patient know that I do recommend the booster for her given her age and chronic medical issues.

## 2020-04-13 ENCOUNTER — Telehealth: Payer: Self-pay

## 2020-04-13 NOTE — Telephone Encounter (Signed)
FYI

## 2020-04-13 NOTE — Telephone Encounter (Signed)
Since I am currently managing her blood sugar, I recommend that she follow-up with [place] to discuss this soon.   If blood sugars are >400 and/or having significant symptoms such as dizziness, nausea, lightheadedness, needs to seek in-person urgent care in the interim.

## 2020-04-13 NOTE — Telephone Encounter (Signed)
Patient has follow up scheduled on 12/29, cardiologist has stopped statin. States that blood sugars are less than 200. Aware to follow up sooner if needed.

## 2020-04-13 NOTE — Telephone Encounter (Signed)
Please call this patient who is very upset that she has went though Thanksgiving without talking with you regarding to her results. I offered her an appointment today but she declined. She has an appointment 04/17/20 but wants to talk with you before. [phone]

## 2020-04-13 NOTE — Telephone Encounter (Signed)
Attempted to reach patient, VM not set up. I will also send MyChart message

## 2020-04-13 NOTE — Telephone Encounter (Signed)
Patient is returning a call from our office and given message about the booster shot. She did want to mention to [facility] that her cardiologist put her on a satin medication and has noticed that her blood sugar has been spiking more and more everyday. Has tried contacting her her cardiologist for weeks to notify them but keeps getting told that someone took a message and Dr.Revankar will call her back but does not hear anything.

## 2020-04-13 NOTE — Telephone Encounter (Signed)
Dr. Geraldo Pitter called and spoke with the patient.

## 2020-04-17 ENCOUNTER — Other Ambulatory Visit (HOSPITAL_BASED_OUTPATIENT_CLINIC_OR_DEPARTMENT_OTHER): Payer: Self-pay | Admitting: Internal Medicine

## 2020-04-17 ENCOUNTER — Ambulatory Visit: Payer: BC Managed Care – PPO | Attending: Internal Medicine

## 2020-04-17 ENCOUNTER — Other Ambulatory Visit: Payer: Self-pay

## 2020-04-17 ENCOUNTER — Ambulatory Visit (INDEPENDENT_AMBULATORY_CARE_PROVIDER_SITE_OTHER): Payer: BC Managed Care – PPO | Admitting: Cardiology

## 2020-04-17 ENCOUNTER — Encounter: Payer: Self-pay | Admitting: Cardiology

## 2020-04-17 VITALS — BP 122/76 | HR 90 | Ht 63.75 in | Wt 176.0 lb

## 2020-04-17 DIAGNOSIS — E119 Type 2 diabetes mellitus without complications: Secondary | ICD-10-CM

## 2020-04-17 DIAGNOSIS — E782 Mixed hyperlipidemia: Secondary | ICD-10-CM

## 2020-04-17 DIAGNOSIS — Z23 Encounter for immunization: Secondary | ICD-10-CM

## 2020-04-17 NOTE — Patient Instructions (Signed)
Medication Instructions:  No medication changes. *If you need a refill on your cardiac medications before your next appointment, please call your pharmacy*   Lab Work: None ordered If you have labs (blood work) drawn today and your tests are completely normal, you will receive your results only by: . MyChart Message (if you have MyChart) OR . A paper copy in the mail If you have any lab test that is abnormal or we need to change your treatment, we will call you to review the results.   Testing/Procedures: None ordered   Follow-Up: At [facility], you and your health needs are our priority.  As part of our continuing mission to provide you with exceptional heart care, we have created designated Provider Care Teams.  These Care Teams include your primary Cardiologist (physician) and Advanced Practice Providers (APPs -  Physician Assistants and Nurse Practitioners) who all work together to provide you with the care you need, when you need it.  We recommend signing up for the patient portal called "MyChart".  Sign up information is provided on this After Visit Summary.  MyChart is used to connect with patients for Virtual Visits (Telemedicine).  Patients are able to view lab/test results, encounter notes, upcoming appointments, etc.  Non-urgent messages can be sent to your provider as well.   To learn more about what you can do with MyChart, go to [email or web address].    Your next appointment:   1 month(s)  The format for your next appointment:   In Person  Provider:   Rajan Revankar, MD   Other Instructions NA  

## 2020-04-17 NOTE — Progress Notes (Signed)
Cardiology Office Note:    Date:  04/17/2020   ID:  Allison Dillon, DOB 1960-08-12, MRN [id number]  PCP:  Inda Coke, PA  Cardiologist:  Jenean Lindau, MD   Referring MD: Inda Coke, PA    ASSESSMENT:    1. Diabetes mellitus without complication ([place])   2. Mixed hyperlipidemia    PLAN:    In order of problems listed above:  1. Primary prevention stressed with the patient.  Importance of compliance with diet medication stressed and she vocalized understanding. 2. Mixed dyslipidemia: Diet was emphasized.  I told her to stop her statin for a month or so.  Subsequently we will review her symptoms.  Diet was emphasized.  She walks half an hour a day regularly and I congratulated her about this. 3. Diabetes mellitus: Last time hemoglobin A1c was markedly elevated and she is in touch with her primary care physician and endocrinologist for this.  She is working hard to make it better.  Risks of diabetes explained and she vocalized understanding. 4. Results of stress test and echocardiogram discussed with her at length.  She vocalized understanding.  Questions were answered to her satisfaction. 5. Patient will be seen in follow-up appointment in 1 months or earlier if the patient has any concerns    Medication Adjustments/Labs and Tests Ordered: Current medicines are reviewed at length with the patient today.  Concerns regarding medicines are outlined above.  No orders of the defined types were placed in this encounter.  No orders of the defined types were placed in this encounter.    No chief complaint on file.    History of Present Illness:    Allison Dillon is a 59 y.o. female.  Patient has past medical history of mixed dyslipidemia and diabetes mellitus.  She denies any problems at this time and takes care of activities of daily living.  No chest pain orthopnea or PND.  She walks half an hour on a regular basis.  She tells me that she could not tolerate  the Lipitor so she stopped it.  At the time of my evaluation, the patient is alert awake oriented and in no distress.  Past Medical History:  Diagnosis Date  . Allergy   . Anxiety   . Chest discomfort 02/20/2020  . Diabetes mellitus ([place])    type 2, diet and exercise controlled  . Diabetes mellitus without complication ([facility])    type 2, diet and exercise controlled  . GERD (gastroesophageal reflux disease)   . History of chicken pox   . Hyperlipidemia   . 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 Migraine 02/17/2020  . Murmur, cardiac 02/20/2020    Past Surgical History:  Procedure Laterality Date  . ABDOMINAL HYSTERECTOMY    . BREAST LUMPECTOMY WITH RADIOACTIVE SEED LOCALIZATION Right 03/16/2019   Procedure: RIGHT BREAST LUMPECTOMY WITH RADIOACTIVE SEED LOCALIZATION;  Surgeon: Fanny Skates, MD;  Location: [place] [facility];  Service: General;  Laterality: Right;  . BREAST SURGERY    . CHOLECYSTECTOMY      Current Medications: Current Meds  Medication Sig  . aspirin EC 81 MG tablet Take 1 tablet (81 mg total) by mouth daily. Swallow whole.  . cholecalciferol (VITAMIN D3) 25 MCG (1000 UT) tablet Take 1,000 Units by mouth daily.  . Cyanocobalamin (VITAMIN B-12) 2500 MCG SUBL Place 1 tablet under the tongue daily.     Allergies:   Contrast media [iodinated diagnostic agents], Penicillins, and Iodine   Social History  Socioeconomic History  . Marital status: Married    Spouse name: Not on file  . Number of children: Not on file  . Years of education: Not on file  . Highest education level: Not on file  Occupational History  . Not on file  Tobacco Use  . Smoking status: Never Smoker  . Smokeless tobacco: Never Used  Vaping Use  . Vaping Use: Never used  Substance and Sexual Activity  . Alcohol use: Never  . Drug use: Never  . Sexual activity: Yes    Birth control/protection: Surgical  Other Topics Concern  . Not on file  Social History  Narrative   Moved here 3 years ago from [place]   Married   4 grown children   16 grandchildren   Social Determinants of Health   Financial Resource Strain:   . Difficulty of Paying Living Expenses: Not on file  Food Insecurity:   . Worried About Charity fundraiser in the Last Year: Not on file  . Ran Out of Food in the Last Year: Not on file  Transportation Needs:   . Lack of Transportation (Medical): Not on file  . Lack of Transportation (Non-Medical): Not on file  Physical Activity:   . Days of Exercise per Week: Not on file  . Minutes of Exercise per Session: Not on file  Stress:   . Feeling of Stress : Not on file  Social Connections:   . Frequency of Communication with Friends and Family: Not on file  . Frequency of Social Gatherings with Friends and Family: Not on file  . Attends Religious Services: Not on file  . Active Member of Clubs or Organizations: Not on file  . Attends Archivist Meetings: Not on file  . Marital Status: Not on file     Family History: The patient's family history includes Arthritis in her father, mother, sister, and sister; Asthma in her father, sister, and sister; Barrett's esophagus in her father; Bipolar disorder in her mother; Birth defects in her daughter and son; Breast cancer (age of onset: 89) in her paternal aunt; COPD in her sister; Depression in her daughter, mother, sister, and son; Diabetes in her father, mother, and sister; Ehlers-Danlos syndrome in her sister; Fibromyalgia in her sister; Hearing loss in her brother; Hiatal hernia in her sister; Hyperlipidemia in her father and sister; Hypertension in her father; Mental illness in her sister; Miscarriages / Stillbirths in her sister and sister; Prostate cancer in her paternal grandfather; Pulmonary embolism in her sister; Skin cancer in her mother; Suicidality in her sister; Swallowing difficulties in her sister; Thyroid disease in her sister.  ROS:   Please see the history of  present illness.    All other systems reviewed and are negative.  EKGs/Labs/Other Studies Reviewed:    The following studies were reviewed today:  IMPRESSIONS    1. Left ventricular ejection fraction, by estimation, is 60 to 65%. The  left ventricle has normal function. The left ventricle has no regional  wall motion abnormalities. Left ventricular diastolic parameters are  consistent with Grade I diastolic  dysfunction (impaired relaxation).  2. Right ventricular systolic function is normal. The right ventricular  size is normal.  3. The mitral valve is normal in structure. No evidence of mitral valve  regurgitation. No evidence of mitral stenosis.  4. The aortic valve is normal in structure. Aortic valve regurgitation is  not visualized. Mild aortic valve sclerosis is present, with no evidence  of aortic  valve stenosis.  5. The inferior vena cava is normal in size with greater than 50%  respiratory variability, suggesting right atrial pressure of 3 mmHg.  Study Highlights   The left ventricular ejection fraction is hyperdynamic (>65%).  Nuclear stress EF: 68%.  There was no ST segment deviation noted during stress.  The study is normal.  This is a low risk study.     Recent Labs: 01/24/2020: BUN 10; Creat 0.70; Hemoglobin 14.2; Platelets 226; Potassium 4.2; Sodium 139 03/28/2020: ALT 30  Recent Lipid Panel    Component Value Date/Time   CHOL 137 03/28/2020 0858   TRIG 139 03/28/2020 0858   HDL 46 03/28/2020 0858   CHOLHDL 3.0 03/28/2020 0858   CHOLHDL 3.9 01/24/2020 1157   LDLCALC 67 03/28/2020 0858   LDLCALC 111 (H) 01/24/2020 1157    Physical Exam:    VS:  BP 122/76   Pulse 90   Ht 5' 3.75" (1.619 m)   Wt 176 lb (79.8 kg)   SpO2 99%   BMI 30.45 kg/m     Wt Readings from Last 3 Encounters:  04/17/20 176 lb (79.8 kg)  03/07/20 184 lb (83.5 kg)  02/20/20 184 lb (83.5 kg)     GEN: Patient is in no acute distress HEENT: Normal NECK: No JVD;  No carotid bruits LYMPHATICS: No lymphadenopathy CARDIAC: Hear sounds regular, 2/6 systolic murmur at the apex. RESPIRATORY:  Clear to auscultation without rales, wheezing or rhonchi  ABDOMEN: Soft, non-tender, non-distended MUSCULOSKELETAL:  No edema; No deformity  SKIN: Warm and dry NEUROLOGIC:  Alert and oriented x 3 PSYCHIATRIC:  Normal affect   Signed, Jenean Lindau, MD  04/17/2020 11:34 AM    [place]

## 2020-04-17 NOTE — Progress Notes (Signed)
   Covid-19 Vaccination Clinic  Name:  Allison Dillon    MRN: [id number] DOB: [birth year]  04/17/2020  [name] was observed post Covid-19 immunization for 30 minutes based on pre-vaccination screening without incident. She was provided with Vaccine Information Sheet and instruction to access the V-Safe system.   [name] was instructed to call 911 with any severe reactions post vaccine: Marland Kitchen Difficulty breathing  . Swelling of face and throat  . A fast heartbeat  . A bad rash all over body  . Dizziness and weakness   Immunizations Administered    No immunizations on file.

## 2020-04-24 MED FILL — MODERNA COVID-19 VACCINE 10: 100 | 1 days supply | Qty: 0 | Fill #0

## 2020-05-09 ENCOUNTER — Encounter: Payer: Self-pay | Admitting: Physician Assistant

## 2020-05-09 ENCOUNTER — Ambulatory Visit (INDEPENDENT_AMBULATORY_CARE_PROVIDER_SITE_OTHER): Payer: BC Managed Care – PPO | Admitting: Physician Assistant

## 2020-05-09 ENCOUNTER — Other Ambulatory Visit: Payer: Self-pay

## 2020-05-09 ENCOUNTER — Telehealth: Payer: Self-pay | Admitting: Cardiology

## 2020-05-09 VITALS — BP 120/80 | HR 69 | Temp 98.1°F | Ht 63.75 in | Wt 177.5 lb

## 2020-05-09 DIAGNOSIS — M5431 Sciatica, right side: Secondary | ICD-10-CM

## 2020-05-09 DIAGNOSIS — E785 Hyperlipidemia, unspecified: Secondary | ICD-10-CM | POA: Diagnosis not present

## 2020-05-09 DIAGNOSIS — E119 Type 2 diabetes mellitus without complications: Secondary | ICD-10-CM

## 2020-05-09 LAB — POCT GLYCOSYLATED HEMOGLOBIN (HGB A1C): Hemoglobin A1C: 6.8 % — AB (ref 4.0–5.6)

## 2020-05-09 NOTE — Telephone Encounter (Signed)
Ok by me

## 2020-05-09 NOTE — Telephone Encounter (Signed)
ok 

## 2020-05-09 NOTE — Progress Notes (Signed)
Allison Dillon is a 59 y.o. female is here for follow up.  I acted as a Neurosurgeon for [employer], PA-C Corky Mull, LPN   History of Present Illness:   Chief Complaint  Patient presents with  . Diabetes    HPI   Diabetes Pt here for 3 month follow up. Currently not taking medications. Stopped Ozempic 2 months ago due to GI issues. She is checking sugars daily average is 120 fasting. Denies signs of hypoglycemia. Exercising regularly -- walking daily.  Wt Readings from Last 4 Encounters:  05/09/20 177 lb 8 oz (80.5 kg)  04/17/20 176 lb (79.8 kg)  03/07/20 184 lb (83.5 kg)  02/20/20 184 lb (83.5 kg)     R buttock and posterior R thigh pain Symptoms started when she was on an airplane a few months ago. He has tried stretching and exercising. She denies: bowel/bladder incontinence, saddle anesthesia, numbness/tingling    HLD Sees Dr. Tomie China and would like another opinion. She is having trouble communicating with him regularly.   There are no preventive care reminders to display for this patient.  Past Medical History:  Diagnosis Date  . Allergy   . Anxiety   . Chest discomfort 02/20/2020  . Diabetes mellitus (HCC)    type 2, diet and exercise controlled  . Diabetes mellitus without complication (HCC)    type 2, diet and exercise controlled  . GERD (gastroesophageal reflux disease)   . History of chicken pox   . Hyperlipidemia   . 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 Migraine 02/17/2020  . Murmur, cardiac 02/20/2020     Social History   Tobacco Use  . Smoking status: Never Smoker  . Smokeless tobacco: Never Used  Vaping Use  . Vaping Use: Never used  Substance Use Topics  . Alcohol use: Never  . Drug use: Never    Past Surgical History:  Procedure Laterality Date  . ABDOMINAL HYSTERECTOMY    . BREAST LUMPECTOMY WITH RADIOACTIVE SEED LOCALIZATION Right 03/16/2019   Procedure: RIGHT BREAST LUMPECTOMY WITH RADIOACTIVE SEED  LOCALIZATION;  Surgeon: Claud Kelp, MD;  Location: [facility];  Service: General;  Laterality: Right;  . BREAST SURGERY    . CHOLECYSTECTOMY      Family History  Problem Relation Age of Onset  . Breast cancer Paternal Aunt 80  . Arthritis Mother   . Depression Mother   . Diabetes Mother   . Bipolar disorder Mother   . Skin cancer Mother   . Arthritis Father   . Asthma Father   . Diabetes Father   . Hyperlipidemia Father   . Hypertension Father   . Barrett's esophagus Father   . Hyperlipidemia Sister   . Miscarriages / Stillbirths Sister   . Hearing loss Brother   . Prostate cancer Paternal Grandfather   . Depression Son   . Birth defects Son   . Depression Daughter   . Birth defects Daughter   . Asthma Sister   . Arthritis Sister   . Depression Sister   . COPD Sister   . Mental illness Sister   . Suicidality Sister   . Miscarriages / Stillbirths Sister   . Pulmonary embolism Sister   . Ehlers-Danlos syndrome Sister   . Diabetes Sister   . Asthma Sister   . Arthritis Sister   . Thyroid disease Sister   . Fibromyalgia Sister   . Swallowing difficulties Sister   . Hiatal hernia Sister  PMHx, SurgHx, SocialHx, FamHx, Medications, and Allergies were reviewed in the Visit Navigator and updated as appropriate.   Patient Active Problem List   Diagnosis Date Noted  . Chest discomfort 02/20/2020  . Murmur, cardiac 02/20/2020  . Migraine 02/17/2020  . Allergy   . Anxiety   . GERD (gastroesophageal reflux disease)   . History of chicken pox   . Hyperlipidemia   . Diabetes mellitus without complication ([name])   . Diabetes mellitus ([name])   . 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 Smoking status: Never Smoker  . Smokeless tobacco: Never Used  Vaping Use  . Vaping Use: Never used  Substance Use Topics  . Alcohol use: Never  . Drug use: Never    Current Medications and Allergies:    Current  Outpatient Medications:  .  aspirin EC 81 MG tablet, Take 1 tablet (81 mg total) by mouth daily. Swallow whole., Disp: 90 tablet, Rfl: 3 .  cholecalciferol (VITAMIN D3) 25 MCG (1000 UT) tablet, Take 1,000 Units by mouth daily., Disp: , Rfl:  .  Cyanocobalamin (VITAMIN B-12) 2500 MCG SUBL, Place 1 tablet under the tongue daily., Disp: , Rfl:  .  OVER THE COUNTER MEDICATION, Take 1 Scoop by mouth daily in the afternoon. Amla powder, Disp: , Rfl:    Allergies  Allergen Reactions  . Contrast Media [Iodinated Diagnostic Agents] Anaphylaxis  . Penicillins Hives  . Iodine     Review of Systems   ROS Negative unless otherwise specified per HPI.  Vitals:   Vitals:   05/09/20 0910  BP: 120/80  Pulse: 69  Temp: 98.1 F (36.7 C)  TempSrc: Temporal  SpO2: 96%  Weight: 177 lb 8 oz (80.5 kg)  Height: 5' 3.75" (1.619 m)     Body mass index is 30.71 kg/m.   Physical Exam:    Physical Exam Vitals and nursing note reviewed.  Constitutional:      General: She is not in acute distress.    Appearance: She is well-developed. She is not ill-appearing, toxic-appearing or sickly-appearing.  Cardiovascular:     Rate and Rhythm: Normal rate and regular rhythm.     Pulses: Normal pulses.     Heart sounds: Normal heart sounds, S1 normal and S2 normal.     Comments: No LE edema Pulmonary:     Effort: Pulmonary effort is normal.     Breath sounds: Normal breath sounds.  Skin:    General: Skin is warm, dry and intact.  Neurological:     Mental Status: She is alert.     GCS: GCS eye subscore is 4. GCS verbal subscore is 5. GCS motor subscore is 6.  Psychiatric:        Mood and Affect: Mood and affect normal.        Speech: Speech normal.        Behavior: Behavior normal. Behavior is cooperative.    Results for orders placed or performed in visit on 05/09/20  POCT glycosylated hemoglobin (Hb A1C)  Result Value Ref Range   Hemoglobin A1C 6.8 (A) 4.0 - 5.6 %    Assessment and Plan:     [place] was seen today for diabetes.  Diagnoses and all orders for this visit:  Diabetes mellitus without complication ([place]) 123456 has improved to 6.8%. Continue current lifestyle changes. Follow-up in 9 months, sooner if concerns. -     POCT glycosylated hemoglobin (Hb A1C)   Right sciatic  nerve pain Denies red flags or any interventions today. She is planning to see her chiropractor. I've asked patient to let us know if we can schedule PT if chiropractor treatment is unsuccessful.   Hyperlipidemia, unspecified hyperlipidemia type Referral to different cardiologist per patient request. -     Ambulatory referral to Cardiology   CMA or LPN served as scribe during this visit. History, Physical, and Plan performed by medical provider. The above documentation has been reviewed and is accurate and complete.  Inda Coke, PA-C [place], [place] 05/09/2020  Follow-up: No follow-ups on file.

## 2020-05-09 NOTE — Telephone Encounter (Signed)
Patient would like to switch providers from Dr. Tomie China to Dr. Cristal Deer.

## 2020-05-09 NOTE — Patient Instructions (Addendum)
It was great to see you!  Keep up the good work with your diabetes. A1c has improved from   Your HgbA1c has improved from 9.5 to 6.8%!!  I'm going to try to get you cardiologist --> Dr. Jodelle Red or Dr. Chilton Si.  If the chiropractor doesn't help, let me know and we will get you scheduled with Lauren here for physical therapy.  Let's follow-up in 9 months, sooner if you have concerns.  Take care,  Jarold Motto PA-C

## 2020-05-10 ENCOUNTER — Encounter: Payer: Self-pay | Admitting: Internal Medicine

## 2020-05-10 ENCOUNTER — Ambulatory Visit (INDEPENDENT_AMBULATORY_CARE_PROVIDER_SITE_OTHER): Payer: BC Managed Care – PPO | Admitting: Internal Medicine

## 2020-05-10 VITALS — BP 104/70 | HR 80 | Ht 63.25 in | Wt 178.0 lb

## 2020-05-10 DIAGNOSIS — N393 Stress incontinence (female) (male): Secondary | ICD-10-CM

## 2020-05-10 DIAGNOSIS — M5431 Sciatica, right side: Secondary | ICD-10-CM | POA: Diagnosis not present

## 2020-05-10 DIAGNOSIS — N8110 Cystocele, unspecified: Secondary | ICD-10-CM

## 2020-05-10 DIAGNOSIS — R194 Change in bowel habit: Secondary | ICD-10-CM | POA: Diagnosis not present

## 2020-05-10 DIAGNOSIS — E669 Obesity, unspecified: Secondary | ICD-10-CM

## 2020-05-10 NOTE — Patient Instructions (Signed)
You have been scheduled for a colonoscopy. Please follow written instructions given to you at your visit today.  Please pick up your prep supplies at the pharmacy within the next 1-3 days. If you use inhalers (even only as needed), please bring them with you on the day of your procedure.  Due to recent changes in healthcare laws, you may see the results of your imaging and laboratory studies on MyChart before your provider has had a chance to review them.  We understand that in some cases there may be results that are confusing or concerning to you. Not all laboratory results come back in the same time frame and the provider may be waiting for multiple results in order to interpret others.  Please give [place] 48 hours in order for your provider to thoroughly review all the results before contacting the office for clarification of your results.   Normal BMI (Body Mass Index- based on height and weight) is between 19 and 25. Your BMI today is Body mass index is 31.28 kg/m. Marland Kitchen Please consider follow up  regarding your BMI with your Primary Care Provider.  I appreciate the opportunity to care for you. Stan Head, MD, [facility]

## 2020-05-10 NOTE — Progress Notes (Signed)
Allison Dillon 59 y.o. 05-19-1960 [id number]  Assessment & Plan:   Encounter Diagnoses  Name Primary?  . Change in bowel habit Yes  . SUI (stress urinary incontinence, female)   . Bladder prolapse, female, acquired   . Right sided sciatica   . Obesity (BMI 30-39.9)     Schedule colonoscopy to evaluate change in bowel habits.  She could have pelvic floor weakness in fact she most likely does given that she apparently has some prolapse of her bladder and stress urinary incontinence.  It could be affecting her bowel habits.  However a colonoscopy is appropriate to evaluate for more serious causes.  The risks and benefits as well as alternatives of endoscopic procedure(s) have been discussed and reviewed. All questions answered. The patient agrees to proceed.   A functional rectal exam will be done prior to sedation at the time of colonoscopy as well.  She may be a candidate for pelvic floor physical therapy.  We did discuss how that is a conservative first step but sometimes surgery is necessary depending upon the nature of her pelvic floor problems and since she does have a bladder prolapse that could be an indication though I gather it is not thought to be so severe since her gynecologist did not recommend any intervention.  The patient is congratulated on her weight loss through changing her diet and significant improvement of her diabetes status.  She will continue with her plant-based low-fat diet and intermittent fasting.  We did have some discussion how I think it is reasonable to eat protein and fat that is animal-based as there are studies that support that but she seems to be doing well and she says that when she eats protein she tends to get hyperglycemic for some reason.  I do not think her sciatica is likely to be related to her bowel and bladder issues though lumbosacral spine problems can be I think unlikely in this case.  I appreciate the opportunity to care for this  patient.  CC: Inda Coke, [place] Marylynn Pearson, MD Subjective:   Chief Complaint: Change in bowel habits  HPI The patient is referred by Deliah Boston, PA-C because of change in bowel habits, she reports going from generally loose and sometimes explosive stools for many many years they are having formed stools that can be narrow or normal size.  She has had some constipation which is not quite as severe as it was, she is generally getting an urge to defecate and has some straining at times.  Kiwi magnesium and increased hydration of helped.  She associates the change in bowel habits when she moved to a more plant-based [employer] low-fat diet earlier this year to treat her weight issues and help with her lipids.  She has lost weight as outlined below.  She has had some occasional left lower quadrant pain radiating to the back that is not associated with defecation.  She wonders if that was related to the use of a statin and that has been held by cardiology.  She was treated with Trulicity which caused upper abdominal discomfort so she discontinued that.  She has a remote history of having a colonoscopy 15 to 20 years ago and it was a bad experience because of under sedation and she awakened during the procedure and that has led her to avoid having a repeat colonoscopy though she is willing to do so now.  She has a history of cholecystectomy and relates some of her other  bowel issues with that.  She is not having any other active GI problems at this time.  She does have some stress urinary incontinence and has a history of uterine prolapse status post hysterectomy for that.  She has a sister that might have celiac disease.  The patient herself believes she has been tested for celiac disease in the past and it was negative.  She wonders if sciatica has any relationship to her bowel issues.  She had an MRI of the lumbar spine area because of a subcutaneous lesion that turned out to be a lipoma, this  test on 02/22/2020 demonstrated the lipoma and lumbar degenerative disc disease which was partially characterized given the MRI protocol to assess the soft tissues.  She is about to see a chiropractor for her sciatica on the right.  Wt Readings from Last 3 Encounters:  05/10/20 178 lb (80.7 kg)  05/09/20 177 lb 8 oz (80.5 kg)  04/17/20 176 lb (79.8 kg)   03/16/2019 was 87.9 kg Hemoglobin A1c March 2020 was 10.89.5 in September 2021 and 6.8 on 05/09/2020  Social history pertinent for moving here from [place] in 2019 to help care for her grandchildren and family as her daughter-in-law had serious health problems Allergies  Allergen Reactions  . Contrast Media [Iodinated Diagnostic Agents] Anaphylaxis  . Penicillins Hives  . Iodine    Current Meds  Medication Sig  . aspirin EC 81 MG tablet Take 1 tablet (81 mg total) by mouth daily. Swallow whole.  . cholecalciferol (VITAMIN D3) 25 MCG (1000 UT) tablet Take 1,000 Units by mouth daily.  . Cyanocobalamin (VITAMIN B-12) 2500 MCG SUBL Place 1 tablet under the tongue daily.  Marland Kitchen MAGNESIUM PO Take 1 tablet by mouth as needed.  Marland Kitchen OVER THE COUNTER MEDICATION Take 1 Scoop by mouth daily in the afternoon. Amla powder   Past Medical History:  Diagnosis Date  . Allergy   . Anxiety   . Arthritis   . Chest discomfort 02/20/2020  . Diabetes mellitus ([place])    type 2, diet and exercise controlled  . Fatty liver   . Gallstones   . GERD (gastroesophageal reflux disease)   . History of chicken pox   . HTN (hypertension)   . Hyperlipidemia   . IBS (irritable bowel syndrome)   . 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 Migraine 02/17/2020  . Murmur, cardiac 02/20/2020  . Pneumonia   . Uterine prolapse    Past Surgical History:  Procedure Laterality Date  . ABDOMINAL HYSTERECTOMY    . BREAST LUMPECTOMY WITH RADIOACTIVE SEED LOCALIZATION Right 03/16/2019   Procedure: RIGHT BREAST LUMPECTOMY WITH RADIOACTIVE SEED LOCALIZATION;  Surgeon:  Fanny Skates, MD;  Location: [facility];  Service: General;  Laterality: Right;  . CHOLECYSTECTOMY    . COLONOSCOPY     Social History   Social History Narrative   Moved here 3 years ago from [place]   Married   4 grown children   16 grandchildren   family history includes Arthritis in her father, mother, sister, and sister; Asthma in her father, sister, and sister; Barrett's esophagus in her father and sister; Bipolar disorder in her mother; Birth defects in her daughter and son; Breast cancer (age of onset: 71) in her paternal aunt; COPD in her father and sister; Depression in her daughter, mother, sister, and son; Diabetes in her father, mother, and sister; Ehlers-Danlos syndrome in her sister; Fibromyalgia in her sister; Hearing loss in her brother; Hiatal hernia in  her sister; Hyperlipidemia in her father and sister; Hypertension in her father; Mental illness in her sister; Miscarriages / Stillbirths in her sister and sister; Prostate cancer in her paternal grandfather; Pulmonary embolism in her sister; Skin cancer in her mother; Suicidality in her sister; Thyroid disease in her sister.   Review of Systems See HPI has some back pain having sciatica issues some insomnia urine issues as above perhaps a depressed mood some fatigue and anxious mood.  All other review of systems are negative.  Objective:   Physical Exam BP 104/70 (BP Location: Left Arm, Patient Position: Sitting, Cuff Size: Normal)   Pulse 80   Ht 5' 3.25" (1.607 m) Comment: height measured without shoes  Wt 178 lb (80.7 kg)   BMI 31.28 kg/m  Well-developed well-nourished overweight to obese white woman in no acute distress Eyes are anicteric Lungs are clear Heart sounds are normal The abdomen is somewhat obese soft initially tender in the epigastric/left upper quadrant area but not reproducible and there is no organomegaly mass or hernia.  Bowel sounds are present. Rectal exam is deferred until  colonoscopy She is alert and oriented x3 and has an appropriate mood and affect There is a small subcutaneous lipoma to the left of the midline in the lumbar spine region   Data reviewed includes primary care notes and labs in the EMR from this year I have also reviewed cardiology notes MRI results see HPI also

## 2020-06-07 ENCOUNTER — Ambulatory Visit: Payer: BC Managed Care – PPO | Admitting: Cardiology

## 2020-06-13 ENCOUNTER — Telehealth: Payer: Self-pay | Admitting: Internal Medicine

## 2020-06-13 NOTE — Telephone Encounter (Signed)
I understand  No charge

## 2020-06-13 NOTE — Telephone Encounter (Signed)
Hi Dr. Carlean Purl, this patient just called to cancel procedure that was scheduled on Friday 2/4 because she got up with Covid like sxs and is trying to get an appt to get tested. Patient stated to feel terribly sorry for having to cancel her procedure. She will call back to reschedule. Thank you.

## 2020-06-15 ENCOUNTER — Encounter: Payer: BC Managed Care – PPO | Admitting: Internal Medicine

## 2020-06-18 ENCOUNTER — Ambulatory Visit: Payer: BC Managed Care – PPO | Admitting: Cardiology

## 2020-07-11 ENCOUNTER — Encounter: Payer: Self-pay | Admitting: Physician Assistant

## 2020-07-18 ENCOUNTER — Encounter: Payer: Self-pay | Admitting: Physician Assistant

## 2020-07-18 ENCOUNTER — Ambulatory Visit (INDEPENDENT_AMBULATORY_CARE_PROVIDER_SITE_OTHER): Payer: BC Managed Care – PPO | Admitting: Physician Assistant

## 2020-07-18 ENCOUNTER — Other Ambulatory Visit: Payer: Self-pay

## 2020-07-18 VITALS — BP 140/80 | HR 89 | Temp 97.7°F | Ht 63.25 in | Wt 184.0 lb

## 2020-07-18 DIAGNOSIS — K130 Diseases of lips: Secondary | ICD-10-CM

## 2020-07-18 DIAGNOSIS — E119 Type 2 diabetes mellitus without complications: Secondary | ICD-10-CM

## 2020-07-18 DIAGNOSIS — R599 Enlarged lymph nodes, unspecified: Secondary | ICD-10-CM

## 2020-07-18 MED ORDER — OZEMPIC (0.25 OR 0.5 MG/DOSE) 2 MG/1.5ML ~~LOC~~ SOPN: 0.2500 mg | PEN_INJECTOR | SUBCUTANEOUS | 0 refills | Status: DC

## 2020-07-18 NOTE — Addendum Note (Signed)
Addended by: Marian Sorrow on: 07/18/2020 04:29 PM   Modules accepted: Orders

## 2020-07-18 NOTE — Progress Notes (Signed)
Allison Dillon is a 60 y.o. female is here for follow up.  I acted as a Education administrator for [employer], PA-C Anselmo Pickler, LPN   History of Present Illness:   Chief Complaint  Patient presents with  . Diabetes  . Adenopathy    HPI  Diabetes Pt her for 2 month f/u, currently not on medication. Pt has been checking fasting sugars daily averaging 180's. Pt has had no energy and sugars have been high for her. She is currently not on medications. She was on trulicity at some point but was afraid that this was causing some issues so she stopped it, however she did have good results with this lowering her blood sugars. She has been very stressed with family issues and admits to not eating as well as normal and thinks that this is contributing to her symptoms.  Adenopathy; Skin lesion Pt has been noticing lymph nodes swollen in her neck off and on past few months. Pt has not used any medication. She also reports a new lesion to her lip, has never noticed it prior to yesterday.    There are no preventive care reminders to display for this patient.  Past Medical History:  Diagnosis Date  . Allergy   . Anxiety   . Arthritis   . Chest discomfort 02/20/2020  . Diabetes mellitus ([place])    type 2, diet and exercise controlled  . Fatty liver   . Gallstones   . GERD (gastroesophageal reflux disease)   . History of chicken pox   . HTN (hypertension)   . Hyperlipidemia   . IBS (irritable bowel syndrome)   . 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 Migraine 02/17/2020  . Murmur, cardiac 02/20/2020  . Pneumonia   . Uterine prolapse      Social History   Tobacco Use  . Smoking status: Never Smoker  . Smokeless tobacco: Never Used  Vaping Use  . Vaping Use: Never used  Substance Use Topics  . Alcohol use: Never  . Drug use: Never    Past Surgical History:  Procedure Laterality Date  . ABDOMINAL HYSTERECTOMY    . BREAST LUMPECTOMY WITH RADIOACTIVE SEED  LOCALIZATION Right 03/16/2019   Procedure: RIGHT BREAST LUMPECTOMY WITH RADIOACTIVE SEED LOCALIZATION;  Surgeon: Fanny Skates, MD;  Location: [facility];  Service: General;  Laterality: Right;  . CHOLECYSTECTOMY    . COLONOSCOPY      Family History  Problem Relation Age of Onset  . Breast cancer Paternal Aunt 74  . Arthritis Mother   . Depression Mother   . Diabetes Mother   . Bipolar disorder Mother   . Skin cancer Mother   . Arthritis Father   . Asthma Father   . Diabetes Father   . Hyperlipidemia Father   . Hypertension Father   . Barrett's esophagus Father   . COPD Father   . Hyperlipidemia Sister   . Miscarriages / Stillbirths Sister   . Hearing loss Brother   . Prostate cancer Paternal Grandfather   . Depression Son   . Birth defects Son   . Depression Daughter   . Birth defects Daughter   . Asthma Sister   . Arthritis Sister   . Depression Sister   . COPD Sister   . Mental illness Sister   . Suicidality Sister   . Miscarriages / Stillbirths Sister   . Pulmonary embolism Sister   . Ehlers-Danlos syndrome Sister   . Diabetes Sister   .  Asthma Sister   . Arthritis Sister   . Thyroid disease Sister   . Fibromyalgia Sister   . Hiatal hernia Sister   . Barrett's esophagus Sister     PMHx, SurgHx, SocialHx, FamHx, Medications, and Allergies were reviewed in the Visit Navigator and updated as appropriate.   Patient Active Problem List   Diagnosis Date Noted  . Obesity (BMI 30-39.9) 05/10/2020  . Right sided sciatica 05/10/2020  . Chest discomfort 02/20/2020  . Murmur, cardiac 02/20/2020  . Migraine 02/17/2020  . Allergy   . Anxiety   . GERD (gastroesophageal reflux disease)   . History of chicken pox   . Hyperlipidemia   . Diabetes mellitus without complication ([place])   . Diabetes mellitus ([place])   . 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 Smoking status: Never Smoker  . Smokeless tobacco:  Never Used  Vaping Use  . Vaping Use: Never used  Substance Use Topics  . Alcohol use: Never  . Drug use: Never    Current Medications and Allergies:    Current Outpatient Medications:  .  aspirin EC 81 MG tablet, Take 1 tablet (81 mg total) by mouth daily. Swallow whole., Disp: 90 tablet, Rfl: 3 .  cholecalciferol (VITAMIN D3) 25 MCG (1000 UT) tablet, Take 1,000 Units by mouth daily., Disp: , Rfl:  .  Cyanocobalamin (VITAMIN B-12) 2500 MCG SUBL, Place 1 tablet under the tongue daily., Disp: , Rfl:  .  MAGNESIUM PO, Take 1 tablet by mouth as needed., Disp: , Rfl:  .  OVER THE COUNTER MEDICATION, Take 1 Scoop by mouth daily in the afternoon. Amla powder, Disp: , Rfl:    Allergies  Allergen Reactions  . Contrast Media [Iodinated Diagnostic Agents] Anaphylaxis  . Penicillins Hives  . Iodine     Review of Systems   ROS  Negative unless otherwise specified per HPI.  Vitals:   Vitals:   07/18/20 0907  BP: 140/80  Pulse: 89  Temp: 97.7 F (36.5 C)  TempSrc: Temporal  SpO2: 97%  Weight: 184 lb (83.5 kg)  Height: 5' 3.25" (1.607 m)     Body mass index is 32.34 kg/m.   Physical Exam:    Physical Exam Vitals and nursing note reviewed.  Constitutional:      General: She is not in acute distress.    Appearance: She is well-developed. She is not ill-appearing, toxic-appearing or sickly-appearing.  HENT:     Head: Normocephalic and atraumatic.     Comments: Dark pigmented/ecchymotic lesion to upper lip without TTP    Right Ear: Tympanic membrane, ear canal and external ear normal. Tympanic membrane is not erythematous, retracted or bulging.     Left Ear: Tympanic membrane, ear canal and external ear normal. Tympanic membrane is not erythematous, retracted or bulging.     Nose: Nose normal.     Right Sinus: No maxillary sinus tenderness or frontal sinus tenderness.     Left Sinus: No maxillary sinus tenderness or frontal sinus tenderness.     Mouth/Throat:     Pharynx:  Uvula midline. No posterior oropharyngeal edema or posterior oropharyngeal erythema.  Eyes:     General: Lids are normal.     Conjunctiva/sclera: Conjunctivae normal.  Neck:     Trachea: Trachea normal.  Cardiovascular:     Rate and Rhythm: Normal rate and regular rhythm.     Pulses: Normal pulses.     Heart sounds: Normal heart sounds,  S1 normal and S2 normal.     Comments: No LE edema Pulmonary:     Effort: Pulmonary effort is normal.     Breath sounds: Normal breath sounds. No decreased breath sounds, wheezing, rhonchi or rales.  Lymphadenopathy:     Cervical: Cervical adenopathy present.     Left cervical: Superficial cervical adenopathy present.  Skin:    General: Skin is warm, dry and intact.  Neurological:     Mental Status: She is alert.     GCS: GCS eye subscore is 4. GCS verbal subscore is 5. GCS motor subscore is 6.  Psychiatric:        Mood and Affect: Mood and affect normal.        Speech: Speech normal.        Behavior: Behavior normal. Behavior is cooperative.      Assessment and Plan:    Jerusalem was seen today for diabetes and adenopathy.  Diagnoses and all orders for this visit:  Diabetes mellitus without complication ([place]) Uncontrolled. Too soon for A1c. Start ozempic 0.25 mg weekly. Sample provided. Follow-up in 1 month to recheck A1c and see how she is doing, sooner if concerns.  Adenopathy No red flags on exam. Symptoms have improved since yesterday. Symptoms are always transient, discussed worsening precautions, red flags, and when we would consider imaging. She verbalized understanding.  Lip lesion ENT referral placed for further evaluation.   CMA or LPN served as scribe during this visit. History, Physical, and Plan performed by medical provider. The above documentation has been reviewed and is accurate and complete.   Inda Coke, PA-C [place], [facility] 07/18/2020  Follow-up: No follow-ups on file.

## 2020-07-18 NOTE — Patient Instructions (Signed)
It was great to see you!  Start ozempic 0.25 mg weekly.  Follow-up in early April we will recheck A1c.  I'm putting in referral to ENT for your lip lesion.  Let me know if you have any concerns in the meantime!  Aldona Bar

## 2020-07-24 ENCOUNTER — Ambulatory Visit: Payer: Self-pay | Admitting: Cardiology

## 2020-07-25 ENCOUNTER — Encounter: Payer: Self-pay | Admitting: General Practice

## 2020-07-26 ENCOUNTER — Encounter: Payer: Self-pay | Admitting: Physician Assistant

## 2020-07-26 NOTE — Telephone Encounter (Signed)
Lattie Haw, can you look into this referral please.

## 2020-08-07 DIAGNOSIS — H6122 Impacted cerumen, left ear: Secondary | ICD-10-CM | POA: Diagnosis not present

## 2020-08-07 DIAGNOSIS — D3701 Neoplasm of uncertain behavior of lip: Secondary | ICD-10-CM | POA: Diagnosis not present

## 2020-10-12 ENCOUNTER — Telehealth: Payer: Self-pay | Admitting: Internal Medicine

## 2020-10-12 NOTE — Telephone Encounter (Signed)
Okay to schedule colon now

## 2020-10-12 NOTE — Telephone Encounter (Signed)
Inbound call from pt stating that she was trying to set up an appt for colon because she had a change in her bm. However I seen that she was seen for the same issue in Dec and she is still having the same issue. Do I need to schedule her for a OV or a direct colon? Please advise on scheduling. Thank you

## 2020-12-21 ENCOUNTER — Encounter: Payer: BC Managed Care – PPO | Admitting: Internal Medicine

## 2021-01-03 DIAGNOSIS — D3701 Neoplasm of uncertain behavior of lip: Secondary | ICD-10-CM | POA: Diagnosis not present

## 2021-01-08 ENCOUNTER — Other Ambulatory Visit: Payer: Self-pay

## 2021-01-08 ENCOUNTER — Ambulatory Visit (AMBULATORY_SURGERY_CENTER): Payer: Self-pay

## 2021-01-08 VITALS — Ht 63.25 in | Wt 195.0 lb

## 2021-01-08 DIAGNOSIS — R194 Change in bowel habit: Secondary | ICD-10-CM

## 2021-01-08 NOTE — Progress Notes (Signed)
Patient is here in-person for PV. Patient denies any allergies to eggs or soy. Patient denies any problems with anesthesia/sedation. Patient denies any oxygen use at home. Patient denies taking any diet/weight loss medications or blood thinners. Patient is aware of our care-partner policy and Covid-19 safety protocol.   EMMI education assigned to the patient for the procedure, sent to MyChart.   Patient is COVID-19 vaccinated. 

## 2021-01-10 DIAGNOSIS — Z86010 Personal history of colonic polyps: Secondary | ICD-10-CM

## 2021-01-10 DIAGNOSIS — Z860101 Personal history of adenomatous and serrated colon polyps: Secondary | ICD-10-CM

## 2021-01-10 HISTORY — DX: Personal history of colonic polyps: Z86.010

## 2021-01-10 HISTORY — DX: Personal history of adenomatous and serrated colon polyps: Z86.0101

## 2021-01-21 ENCOUNTER — Telehealth: Payer: Self-pay | Admitting: Internal Medicine

## 2021-01-21 NOTE — Telephone Encounter (Signed)
Spoke with the patient and answered all questions from the patient.

## 2021-01-21 NOTE — Telephone Encounter (Signed)
Patient called states she is diabetic and has questions with the prep instructions would like to know if she can drink the sugar free Gatorade instead because otherwise she is afraid it will be too high.

## 2021-01-22 ENCOUNTER — Encounter: Payer: Self-pay | Admitting: Internal Medicine

## 2021-01-22 ENCOUNTER — Ambulatory Visit (AMBULATORY_SURGERY_CENTER): Payer: BC Managed Care – PPO | Admitting: Internal Medicine

## 2021-01-22 ENCOUNTER — Other Ambulatory Visit: Payer: Self-pay

## 2021-01-22 ENCOUNTER — Other Ambulatory Visit: Payer: BC Managed Care – PPO

## 2021-01-22 VITALS — BP 126/70 | HR 65 | Temp 97.1°F | Resp 12 | Ht 63.25 in | Wt 195.0 lb

## 2021-01-22 DIAGNOSIS — D122 Benign neoplasm of ascending colon: Secondary | ICD-10-CM | POA: Diagnosis not present

## 2021-01-22 DIAGNOSIS — D123 Benign neoplasm of transverse colon: Secondary | ICD-10-CM

## 2021-01-22 DIAGNOSIS — D175 Benign lipomatous neoplasm of intra-abdominal organs: Secondary | ICD-10-CM

## 2021-01-22 DIAGNOSIS — D120 Benign neoplasm of cecum: Secondary | ICD-10-CM

## 2021-01-22 DIAGNOSIS — D125 Benign neoplasm of sigmoid colon: Secondary | ICD-10-CM

## 2021-01-22 DIAGNOSIS — R194 Change in bowel habit: Secondary | ICD-10-CM | POA: Diagnosis not present

## 2021-01-22 MED ORDER — SODIUM CHLORIDE 0.9 % IV SOLN: 500.0000 mL | Freq: Once | INTRAVENOUS | Status: DC

## 2021-01-22 NOTE — Progress Notes (Signed)
[facility] History and Physical   Primary Care Physician:  Inda Coke, [place]   Reason for Procedure:   Change in bowel habits  Plan:    colonoscopy     HPI: Allison Dillon is a 60 y.o. female here to evaluate a change in bowel habit.she reports going from generally loose and sometimes explosive stools for many many years they are having formed stools that can be narrow or normal size.  She has had some constipation which is not quite as severe as it was, she is generally getting an urge to defecate and has some straining at times.  Kiwi magnesium and increased hydration of helped.  She associates the change in bowel habits when she moved to a more plant-based [employer] low-fat diet earlier this year to treat her weight issues and help with her lipids.  She has lost weight as outlined below.  She has had some occasional left lower quadrant pain radiating to the back that is not associated with defecation.  She wonders if that was related to the use of a statin and that has been held by cardiology.  She was treated with Trulicity which caused upper abdominal discomfort so she discontinued that.  She has a remote history of having a colonoscopy 15 to 20 years ago and it was a bad experience because of under sedation and she awakened during the procedure and that has led her to avoid having a repeat colonoscopy though she is willing to do so now.  She has a history of cholecystectomy and relates some of her other bowel issues with that.  She is not having any other active GI problems at this time.  She does have some stress urinary incontinence and has a history of uterine prolapse status post hysterectomy for that.  She has a sister that might have celiac disease.  The patient herself believes she has been tested for celiac disease in the past and it was negative.   Now she reports constant loose stools.   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 Authorizing Provider  [facility] VER[facility] test strip daily. 09/18/20  Yes [provider]  cholecalciferol (VITAMIN D3) 25 MCG (1000 UT) tablet Take 1,000 Units by mouth daily.    [provider]  Cyanocobalamin (VITAMIN B-12) 2500 MCG SUBL Place 1 tablet under the tongue daily.    [provider]    Current Outpatient Medications  Medication Sig Dispense Refill   ONETOUCH VERIO test strip daily.     cholecalciferol (VITAMIN D3) 25 MCG (1000 UT) tablet Take 1,000 Units by mouth daily.     Cyanocobalamin (VITAMIN B-12) 2500 MCG SUBL Place 1 tablet under the tongue daily.     Current Facility-Administered Medications  Medication Dose Route Frequency Provider Last Rate Last Admin   0.9 %  sodium chloride infusion  500 mL Intravenous Once Gatha Mayer, MD        Allergies as of 01/22/2021 - Review Complete 01/22/2021  Allergen Reaction Noted   Contrast media [iodinated diagnostic agents] Anaphylaxis 03/08/2019   Penicillins Hives 03/08/2019   Iodine  01/24/2020     Family History  Problem Relation Age of Onset   Arthritis Mother    Depression Mother    Diabetes Mother    Bipolar disorder Mother    Skin cancer Mother    Arthritis Father    Asthma Father    Diabetes Father    Hyperlipidemia Father    Hypertension Father    Barrett's esophagus Father    COPD Father    Hyperlipidemia Sister    Miscarriages / [place] Sister    Asthma Sister    Arthritis Sister    Depression Sister    COPD Sister    Mental illness Sister    Suicidality Sister    58 / [place] Sister    Pulmonary embolism Sister    Ehlers-Danlos syndrome Sister    Diabetes Sister    Asthma Sister    Arthritis Sister    Thyroid disease Sister    Fibromyalgia Sister    Hiatal hernia Sister    Barrett's esophagus Sister    Hearing loss Brother    Breast cancer Paternal Aunt 61   Prostate cancer Paternal Grandfather    Depression Daughter    Birth defects Daughter    Depression Son    Birth defects Son    Colon cancer Other    Colon polyps Neg Hx    Esophageal cancer Neg Hx    Rectal cancer Neg Hx    Stomach cancer Neg Hx     Social History   Socioeconomic History   Marital status: Married    Spouse name: Not on file   Number of children: 4   Years of education: Not on file   Highest education level: Not on file  Occupational History   Occupation: Homemaker  Tobacco Use   Smoking status: Never   Smokeless tobacco: Never  Vaping Use   Vaping Use: Never used  Substance and Sexual Activity   Alcohol use: Never   Drug use: Never   Sexual activity: Yes    Birth control/protection: Surgical  Other Topics Concern   Not on file  Social History Narrative   Moved here 3 years ago from [place]   Married   4 grown children   16 grandchildren   Social Determinants of Health   Financial Resource Strain: Not on file  Food Insecurity: Not on file  Transportation Needs: Not on file  Physical Activity: Not on file  Stress: Not on file   Social Connections: Not on file  Intimate Partner Violence: Not on file    Review of Systems:  other review of systems negative except as mentioned in the HPI.  Physical Exam: Vital signs BP 119/86   Pulse 77   Temp (!) 97.1 F (36.2 C) (Temporal)   Ht 5' 3.25" (1.607 m)  Wt 195 lb (88.5 kg)   SpO2 98%   BMI 34.27 kg/m   General:   Alert,  Well-developed, well-nourished, pleasant and cooperative in NAD Lungs:  Clear throughout to auscultation.   Heart:  Regular rate and rhythm; no murmurs, clicks, rubs,  or gallops. Abdomen:  Soft, nontender and nondistended. Normal bowel sounds.   Neuro/Psych:  Alert and cooperative. Normal mood and affect. A and O x 3  Rectal - NL resting tone, anoderm ok Slightly decreased voluntary squeeze Small rectocele Approp abd CTR and descent but can feel pelvic organ pressure on rectum with simulated defecation  '@Kyarra Vancamp'$  Simonne Maffucci, MD, [facility] [phone] (pager) 01/22/2021 9:12 AM@

## 2021-01-22 NOTE — Op Note (Addendum)
[facility] Patient Name: Allison Dillon Procedure Date: 01/22/2021 9:17 AM MRN: [id number] Endoscopist: Gatha Mayer , MD Age: 60 Referring MD:  Date of Birth: November 08, 1960 Gender: Female Account #: [id number] Procedure:                Colonoscopy Indications:              Change in bowel habits Medicines:                Propofol per Anesthesia, Monitored Anesthesia Care Procedure:                Pre-Anesthesia Assessment:                           - Prior to the procedure, a History and Physical                            was performed, and patient medications and                            allergies were reviewed. The patient's tolerance of                            previous anesthesia was also reviewed. The risks                            and benefits of the procedure and the sedation                            options and risks were discussed with the patient.                            All questions were answered, and informed consent                            was obtained. Prior Anticoagulants: The patient has                            taken no previous anticoagulant or antiplatelet                            agents. ASA Grade Assessment: II - A patient with                            mild systemic disease. After reviewing the risks                            and benefits, the patient was deemed in                            satisfactory condition to undergo the procedure.                           After obtaining informed consent, the colonoscope  was passed under direct vision. Throughout the                            procedure, the patient's blood pressure, pulse, and                            oxygen saturations were monitored continuously. The                            Colonoscope was introduced through the anus and                            advanced to the the terminal ileum, with                            identification of the  appendiceal orifice and IC                            valve. The colonoscopy was performed without                            difficulty. The patient tolerated the procedure                            well. The quality of the bowel preparation was                            excellent. The terminal ileum, ileocecal valve,                            appendiceal orifice, and rectum were photographed.                            The bowel preparation used was Miralax via split                            dose instruction. Scope In: 9:29:18 AM Scope Out: 9:48:40 AM Scope Withdrawal Time: 0 hours 17 minutes 8 seconds  Total Procedure Duration: 0 hours 19 minutes 22 seconds  Findings:                 The perianal examination was normal.                           The digital rectal exam findings include pelvic                            organ prolapse suspected on functional rectal exam                            + small rectocele.                           Six sessile polyps were found in the sigmoid colon,  transverse colon, ascending colon and cecum. The                            polyps were 2 to 7 mm in size. These polyps were                            removed with a cold snare. Resection and retrieval                            were complete. Verification of patient                            identification for the specimen was done. Estimated                            blood loss was minimal.                           A 1 mm polyp was found in the ascending colon. The                            polyp was sessile. The polyp was removed with a                            cold biopsy forceps. Resection and retrieval were                            complete. Verification of patient identification                            for the specimen was done. Estimated blood loss was                            minimal.                           There was a small lipoma, in the  ascending colon.                           The exam was otherwise without abnormality on                            direct and retroflexion views.                           Biopsies for histology were taken with a cold                            forceps from the right colon and left colon for                            evaluation of microscopic colitis. Estimated blood  loss was minimal. Complications:            No immediate complications. Estimated Blood Loss:     Estimated blood loss was minimal. Impression:               - Pelvic organ prolapse suspected on functional                            rectal exam + small rectocele found on digital                            rectal exam.                           - Six 2 to 7 mm polyps in the sigmoid colon, in the                            transverse colon, in the ascending colon and in the                            cecum, removed with a cold snare. Resected and                            retrieved.                           - One 1 mm polyp in the ascending colon, removed                            with a cold biopsy forceps. Resected and retrieved.                           - Small lipoma in the ascending colon.                           - The examination was otherwise normal on direct                            and retroflexion views.                           - Biopsies were taken with a cold forceps from the                            right colon and left colon for evaluation of                            microscopic colitis.                           - Will discuss further evakluation and treatment of                            pelvic organ prolapse whic i think is inplicated in  stress urinary incontinence and bowel habit                            problems Will cc Dr. Julien Girt Recommendation:           - Patient has a contact number available for                             emergencies. The signs and symptoms of potential                            delayed complications were discussed with the                            patient. Return to normal activities tomorrow.                            Written discharge instructions were provided to the                            patient.                           - Resume previous diet.                           - Continue present medications.                           - Await pathology results. Celiac testing ordered                            today (sister has and pt w/ loose stools)                           - Repeat colonoscopy is recommended. The                            colonoscopy date will be determined after pathology                            results from today's exam become available for                            review. Gatha Mayer, MD 01/22/2021 10:01:34 AM This report has been signed electronically.

## 2021-01-22 NOTE — Progress Notes (Signed)
VS completed by CW.   Pt's states no medical or surgical changes since previsit or office visit.  

## 2021-01-22 NOTE — Progress Notes (Signed)
Report given to PACU, vss 

## 2021-01-22 NOTE — Patient Instructions (Addendum)
I  found and removed 7 small polyps - all look benign.  I took biopsies of the colon to see if there is microscopic inflammation causing loose stools.  I do think you have pelvic organ prolapse causing some of your problems. You can see dr. Julien Girt about this or I can refer you to a urogynecologist who specializes in these problems you have.  I will let you know pathology results and when to have another routine colonoscopy by mail and/or My Chart.  I appreciate the opportunity to care for you. Gatha Mayer, MD, FACG   YOU HAD AN ENDOSCOPIC PROCEDURE TODAY AT [facility]:   Refer to the procedure report that was given to you for any specific questions about what was found during the examination.  If the procedure report does not answer your questions, please call your gastroenterologist to clarify.  If you requested that your care partner not be given the details of your procedure findings, then the procedure report has been included in a sealed envelope for you to review at your convenience later.  YOU SHOULD EXPECT: Some feelings of bloating in the abdomen. Passage of more gas than usual.  Walking can help get rid of the air that was put into your GI tract during the procedure and reduce the bloating. If you had a lower endoscopy (such as a colonoscopy or flexible sigmoidoscopy) you may notice spotting of blood in your stool or on the toilet paper. If you underwent a bowel prep for your procedure, you may not have a normal bowel movement for a few days.  Please Note:  You might notice some irritation and congestion in your nose or some drainage.  This is from the oxygen used during your procedure.  There is no need for concern and it should clear up in a day or so.  SYMPTOMS TO REPORT IMMEDIATELY:  Following lower endoscopy (colonoscopy or flexible sigmoidoscopy):  Excessive amounts of blood in the stool  Significant tenderness or worsening of abdominal pains  Swelling of  the abdomen that is new, acute  Fever of 100F or higher   For urgent or emergent issues, a gastroenterologist can be reached at any hour by calling [phone]. Do not use MyChart messaging for urgent concerns.    DIET:  We do recommend a small meal at first, but then you may proceed to your regular diet.  Drink plenty of fluids but you should avoid alcoholic beverages for 24 hours.  ACTIVITY:  You should plan to take it easy for the rest of today and you should NOT DRIVE or use heavy machinery until tomorrow (because of the sedation medicines used during the test).    FOLLOW UP: Our staff will call the number listed on your records 48-72 hours following your procedure to check on you and address any questions or concerns that you may have regarding the information given to you following your procedure. If we do not reach you, we will leave a message.  We will attempt to reach you two times.  During this call, we will ask if you have developed any symptoms of COVID 19. If you develop any symptoms (ie: fever, flu-like symptoms, shortness of breath, cough etc.) before then, please call [phone].  If you test positive for Covid 19 in the 2 weeks post procedure, please call and report this information to [place].    If any biopsies were taken you will be contacted by phone or by letter within  the next 1-3 weeks.  Please call us at [phone] if you have not heard about the biopsies in 3 weeks.    SIGNATURES/CONFIDENTIALITY: You and/or your care partner have signed paperwork which will be entered into your electronic medical record.  These signatures attest to the fact that that the information above on your After Visit Summary has been reviewed and is understood.  Full responsibility of the confidentiality of this discharge information lies with you and/or your care-partner.    Resume medications. Information given on polyps.

## 2021-01-22 NOTE — Progress Notes (Signed)
Called to room to assist during endoscopic procedure.  Patient ID and intended procedure confirmed with present staff. Received instructions for my participation in the procedure from the performing physician.  

## 2021-01-23 LAB — IGA: Immunoglobulin A: 411 mg/dL — ABNORMAL HIGH (ref 47–310)

## 2021-01-23 LAB — TISSUE TRANSGLUTAMINASE, IGA: (tTG) Ab, IgA: <1 U/mL

## 2021-01-24 ENCOUNTER — Telehealth: Payer: Self-pay

## 2021-01-24 NOTE — Telephone Encounter (Signed)
  Follow up Call-  Call back number 01/22/2021  Post procedure Call Back phone  # [phone]  Permission to leave phone message Yes     Patient questions:  Do you have a fever, pain , or abdominal swelling? No. Pain Score  0 *  Have you tolerated food without any problems? Yes.    Have you been able to return to your normal activities? Yes.    Do you have any questions about your discharge instructions: Diet   No. Medications  No. Follow up visit  No.  Do you have questions or concerns about your Care? No.  Actions: * If pain score is 4 or above: No action needed, pain <4.  Have you developed a fever since your procedure? no  2.   Have you had an respiratory symptoms (SOB or cough) since your procedure? no  3.   Have you tested positive for COVID 19 since your procedure no  4.   Have you had any family members/close contacts diagnosed with the COVID 19 since your procedure?  no   If yes to any of these questions please route to Joylene John, RN and Joella Prince, RN  Pt had questions about "thickening" that was mentioned in her paperwork.  Reviewed colonoscopy report with pt, and there was no mention of "thickening".  Advised to call Dr. Celesta Aver office for further information.  Pt agreed.

## 2021-01-30 ENCOUNTER — Encounter: Payer: Self-pay | Admitting: Internal Medicine

## 2021-02-13 ENCOUNTER — Telehealth: Payer: Self-pay | Admitting: Internal Medicine

## 2021-02-13 NOTE — Telephone Encounter (Signed)
Called Dr. Julien Girt, [place] office (GYN) to follow-up on our referral to them for pelvic organ prolapse. I spoke to Butch Penny and she stated the patient already has an appt. In Dec. And she will call the patient.

## 2021-02-13 NOTE — Telephone Encounter (Signed)
Inbound call from patient. States she was wondering if the referral for her organ prolapse is in process.   Also, she is requesting a letter to explain her condition so she can send to jury [employer]

## 2021-02-13 NOTE — Telephone Encounter (Signed)
Butch Penny called back and stated patient is now scheduled for 02/15/2021 at 10:20 am.

## 2021-02-15 DIAGNOSIS — N819 Female genital prolapse, unspecified: Secondary | ICD-10-CM | POA: Diagnosis not present

## 2021-02-15 DIAGNOSIS — N952 Postmenopausal atrophic vaginitis: Secondary | ICD-10-CM | POA: Diagnosis not present

## 2021-04-11 DIAGNOSIS — Z01419 Encounter for gynecological examination (general) (routine) without abnormal findings: Secondary | ICD-10-CM | POA: Diagnosis not present

## 2021-04-11 DIAGNOSIS — Z13820 Encounter for screening for osteoporosis: Secondary | ICD-10-CM | POA: Diagnosis not present

## 2021-04-11 DIAGNOSIS — Z1329 Encounter for screening for other suspected endocrine disorder: Secondary | ICD-10-CM | POA: Diagnosis not present

## 2021-04-11 DIAGNOSIS — Z6832 Body mass index (BMI) 32.0-32.9, adult: Secondary | ICD-10-CM | POA: Diagnosis not present

## 2021-04-11 DIAGNOSIS — Z1231 Encounter for screening mammogram for malignant neoplasm of breast: Secondary | ICD-10-CM | POA: Diagnosis not present

## 2021-04-15 DIAGNOSIS — E119 Type 2 diabetes mellitus without complications: Secondary | ICD-10-CM | POA: Diagnosis not present

## 2021-04-15 DIAGNOSIS — H2513 Age-related nuclear cataract, bilateral: Secondary | ICD-10-CM | POA: Diagnosis not present

## 2021-04-15 DIAGNOSIS — H02834 Dermatochalasis of left upper eyelid: Secondary | ICD-10-CM | POA: Diagnosis not present

## 2021-04-15 DIAGNOSIS — H02423 Myogenic ptosis of bilateral eyelids: Secondary | ICD-10-CM | POA: Diagnosis not present

## 2021-04-15 LAB — HM DIABETES EYE EXAM

## 2021-04-16 ENCOUNTER — Encounter: Payer: Self-pay | Admitting: Physician Assistant

## 2021-05-29 DIAGNOSIS — S63502A Unspecified sprain of left wrist, initial encounter: Secondary | ICD-10-CM | POA: Diagnosis not present

## 2021-05-29 DIAGNOSIS — M25532 Pain in left wrist: Secondary | ICD-10-CM | POA: Diagnosis not present

## 2021-06-06 DIAGNOSIS — Z6832 Body mass index (BMI) 32.0-32.9, adult: Secondary | ICD-10-CM | POA: Diagnosis not present

## 2021-06-06 DIAGNOSIS — N819 Female genital prolapse, unspecified: Secondary | ICD-10-CM | POA: Diagnosis not present

## 2021-06-06 DIAGNOSIS — N812 Incomplete uterovaginal prolapse: Secondary | ICD-10-CM | POA: Diagnosis not present

## 2021-06-06 DIAGNOSIS — R32 Unspecified urinary incontinence: Secondary | ICD-10-CM | POA: Diagnosis not present

## 2021-06-06 DIAGNOSIS — R152 Fecal urgency: Secondary | ICD-10-CM | POA: Diagnosis not present

## 2021-06-06 DIAGNOSIS — N952 Postmenopausal atrophic vaginitis: Secondary | ICD-10-CM | POA: Diagnosis not present

## 2021-06-26 DIAGNOSIS — S63502D Unspecified sprain of left wrist, subsequent encounter: Secondary | ICD-10-CM | POA: Diagnosis not present

## 2021-07-15 DIAGNOSIS — R52 Pain, unspecified: Secondary | ICD-10-CM | POA: Diagnosis not present

## 2021-07-15 DIAGNOSIS — S63502D Unspecified sprain of left wrist, subsequent encounter: Secondary | ICD-10-CM | POA: Diagnosis not present

## 2021-07-15 DIAGNOSIS — M25632 Stiffness of left wrist, not elsewhere classified: Secondary | ICD-10-CM | POA: Diagnosis not present

## 2021-09-10 ENCOUNTER — Other Ambulatory Visit: Payer: Self-pay | Admitting: Physician Assistant

## 2021-09-10 NOTE — Telephone Encounter (Signed)
Last done by historical provider  

## 2021-09-18 IMAGING — MG MM PLC BREAST LOC DEV 1ST LESION INC*R*
8 series · 8 of 8 positions shown · non-contrast
Comparison: Previous exam(s).

CLINICAL DATA: 58-year-old female for radioactive seed localization
of RIGHT breast papillary lesion prior to excision.

EXAM:
MAMMOGRAPHIC GUIDED RADIOACTIVE SEED LOCALIZATION OF THE RIGHT
BREAST

[R CC (1 of 4)]
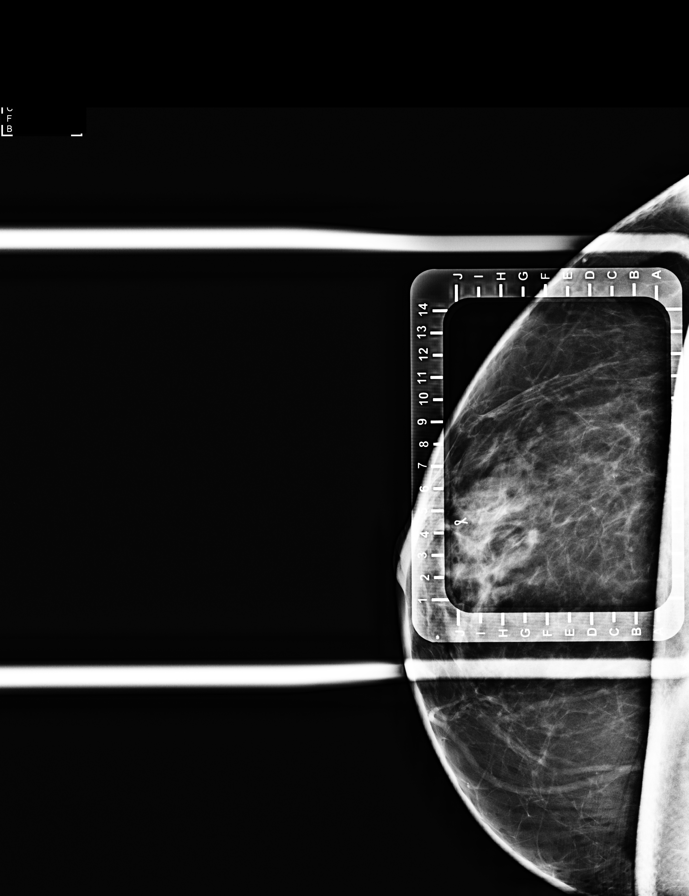

[R CC (2 of 4)]
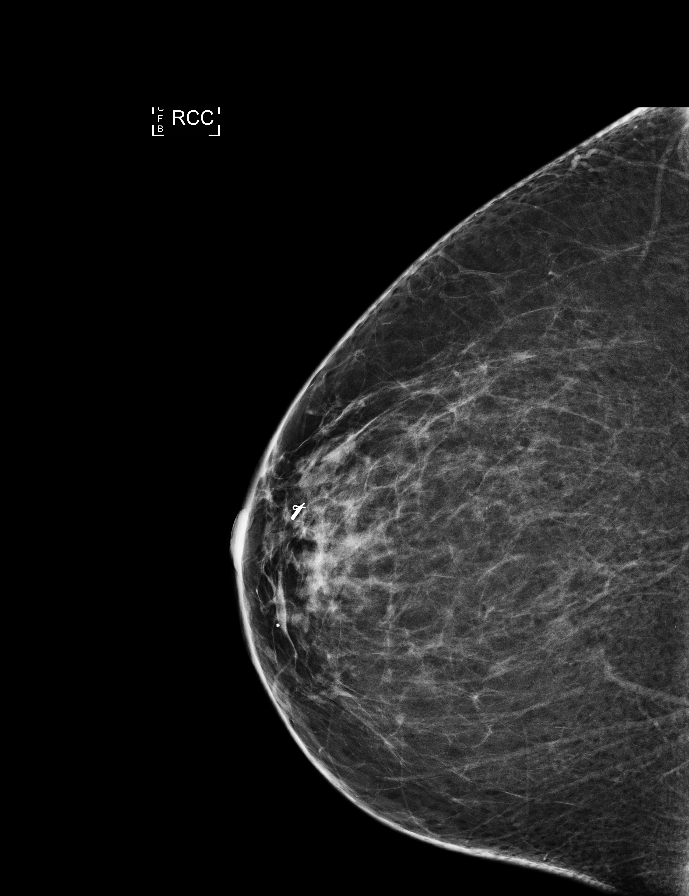

[R CC (3 of 4)]
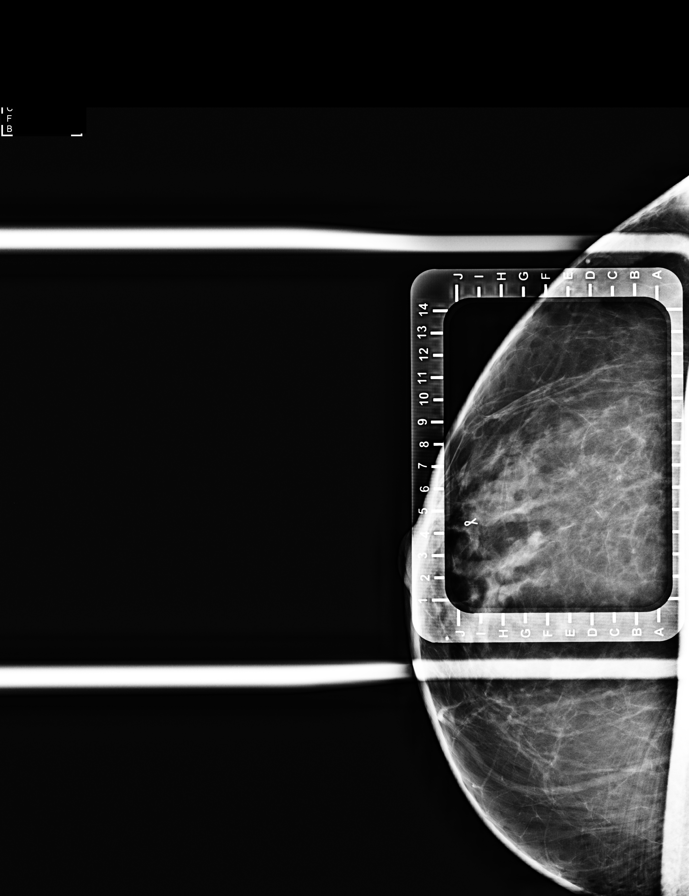

[R ML (1 of 4)]
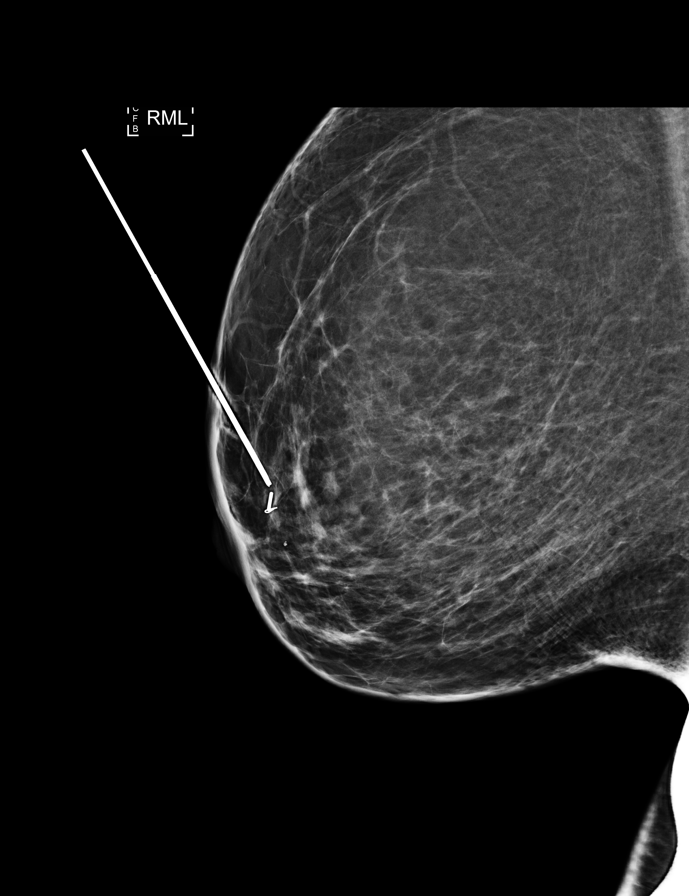

[R ML (2 of 4)]
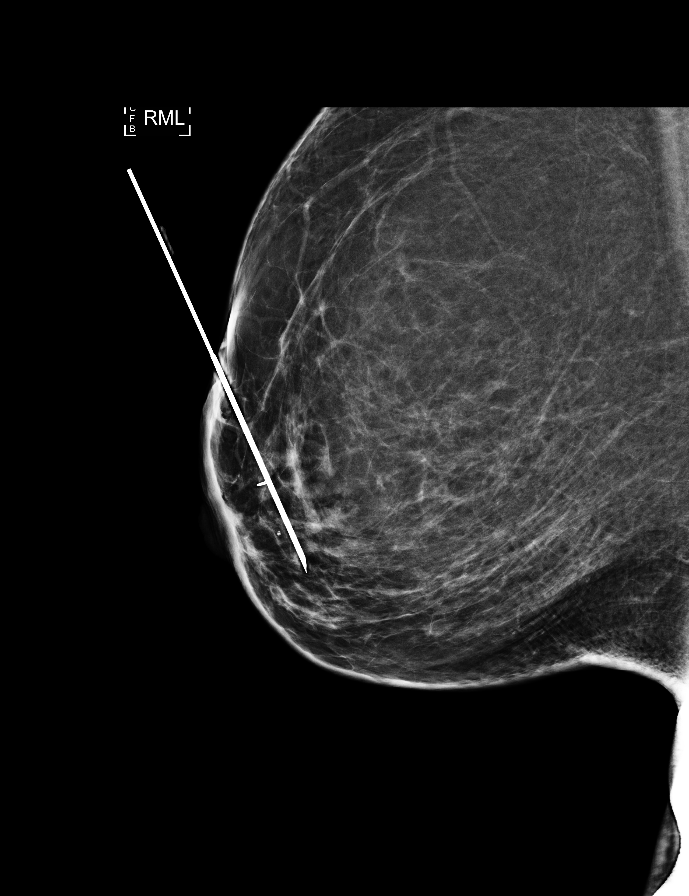

[R CC (4 of 4)]
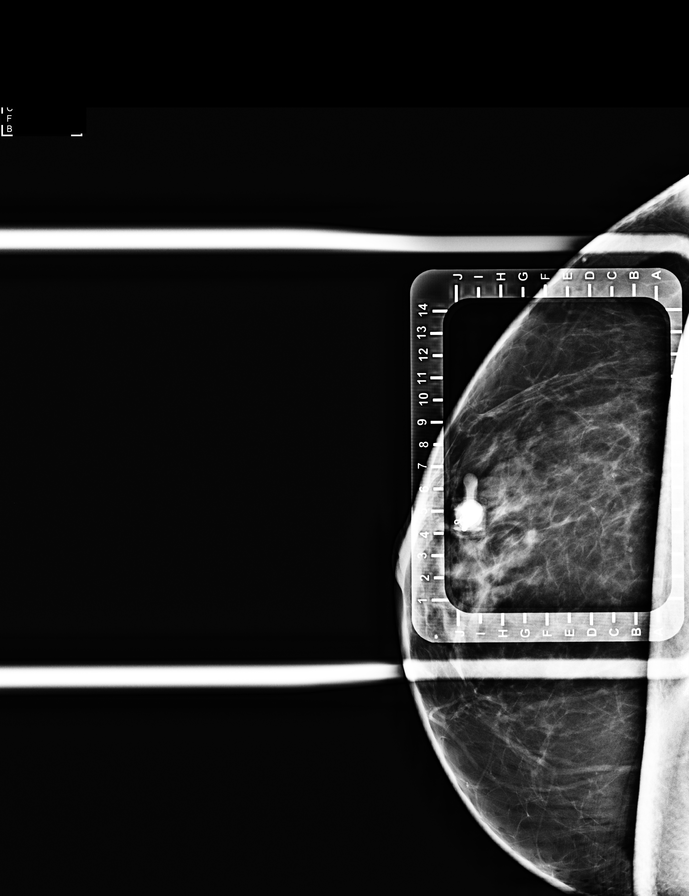

[R ML (3 of 4)]
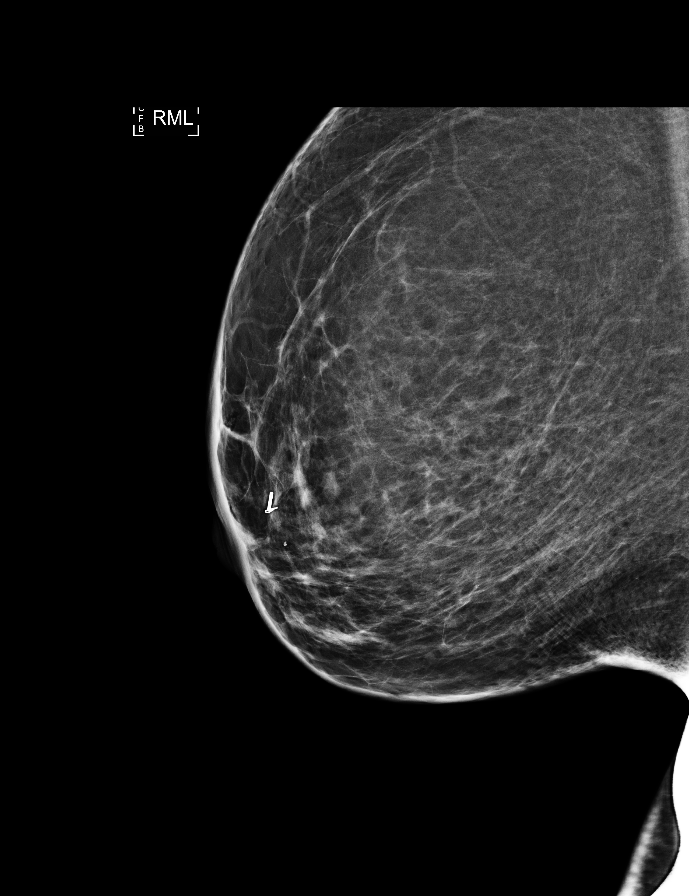

[R ML (4 of 4)]
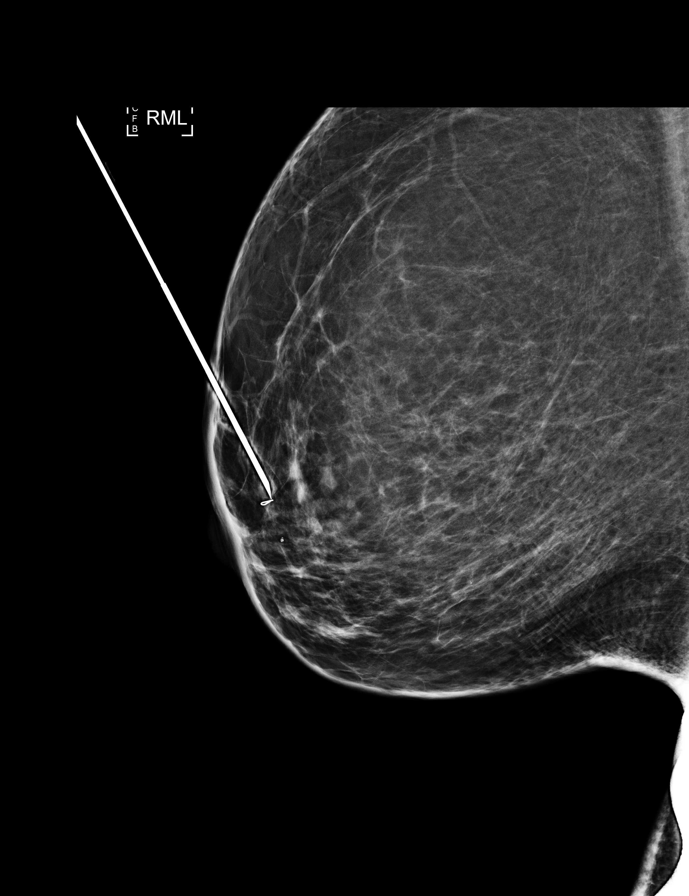

[8 of 8 positions shown; findings below may reference images not displayed]

FINDINGS: Patient presents for radioactive seed localization prior to RIGHT
breast excision. I met with the patient and we discussed the
procedure of seed localization including benefits and alternatives.
We discussed the high likelihood of a successful procedure. We
discussed the risks of the procedure including infection, bleeding,
tissue injury and further surgery. We discussed the low dose of
radioactivity involved in the procedure. Informed, written consent
was given.

The usual time-out protocol was performed immediately prior to the
procedure.

Using mammographic guidance, sterile technique, 1% lidocaine and an
H-X2C radioactive seed, the RIBBON clip was localized using a
SUPERIOR approach. The follow-up mammogram images confirm the seed
in the expected location and were marked for Dr. Tiger.

Follow-up survey of the patient confirms presence of the radioactive
seed.

Order number of H-X2C seed:  [id number].

Total activity:  02/16/2019.  Reference Date: [DATE] millicuries.

The patient tolerated the procedure well and was released from the
[REDACTED]. She was given instructions regarding seed removal.
IMPRESSION: Radioactive seed localization RIGHT breast. No apparent
complications.

## 2021-09-19 IMAGING — DX MM BREAST SURGICAL SPECIMEN
1 series · 2 of 2 positions shown · non-contrast
Comparison: Previous exam(s).

CLINICAL DATA: 58-year-old patient underwent radioactive seed
localization of a papillary lesion prior to excision.

EXAM:
SPECIMEN RADIOGRAPH OF THE RIGHT BREAST

[Series 2: specimen digital x-ray, derived · right · 2 of 2 slices shown]
[im 1/2]
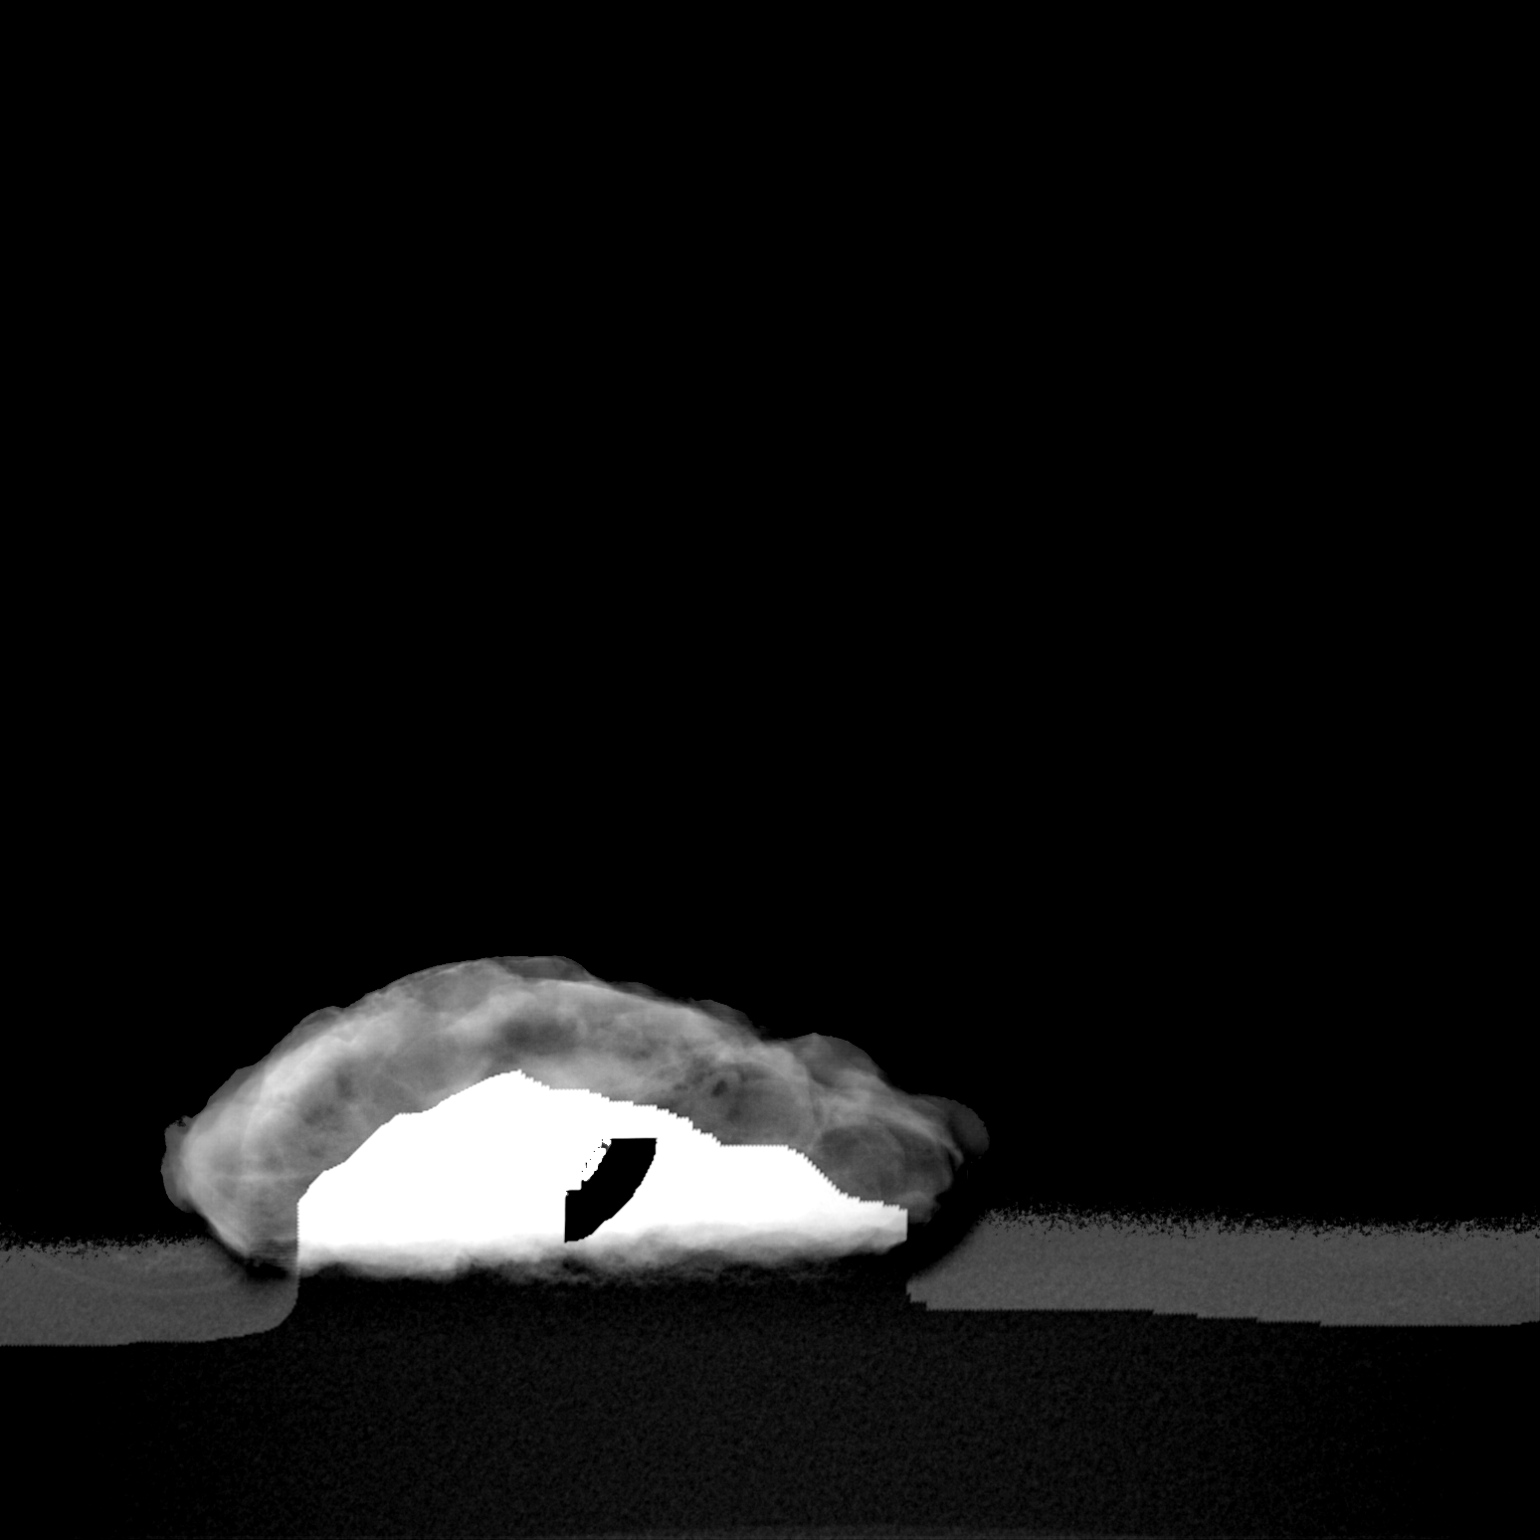
[im 2/2]
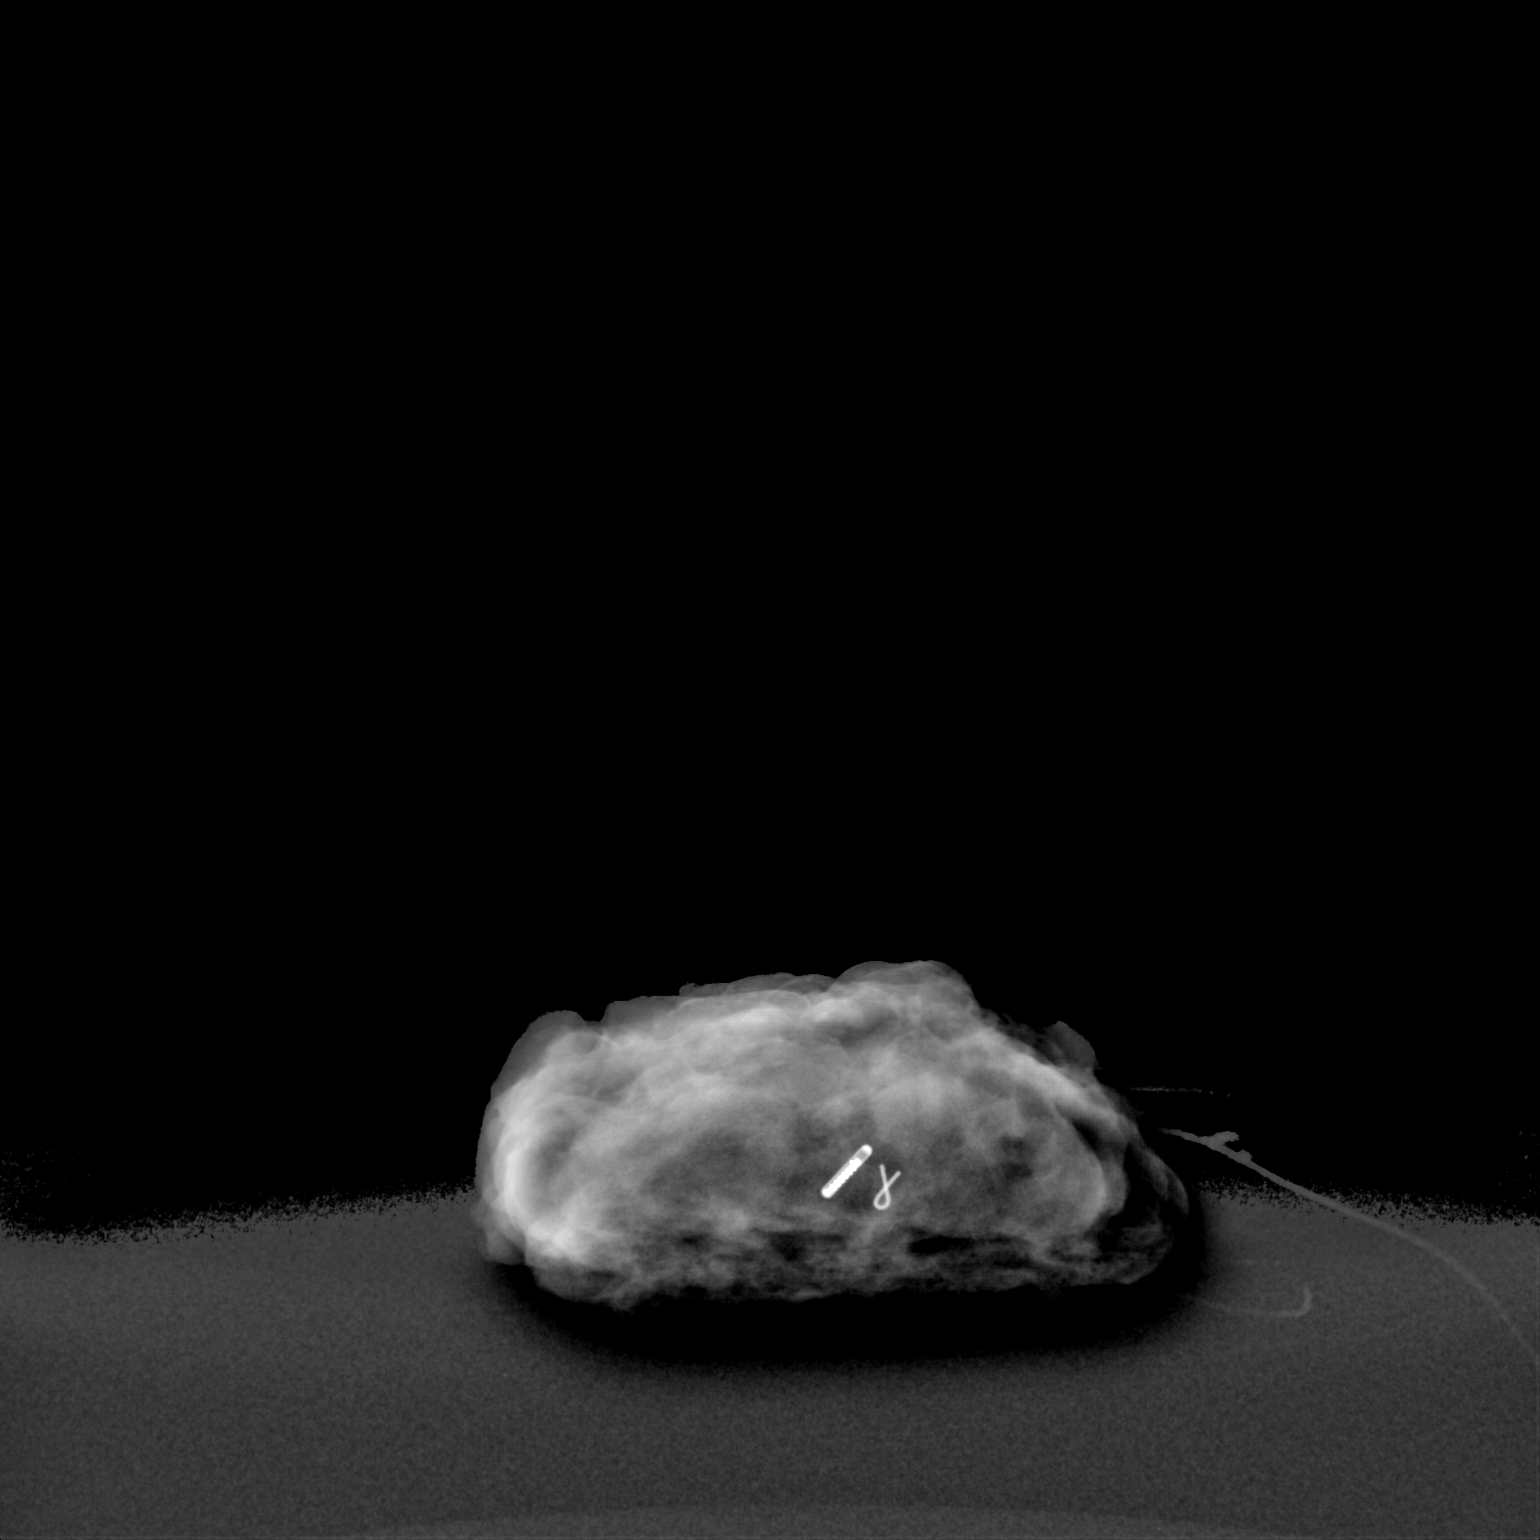

[2 of 2 positions shown; findings below may reference images not displayed]

FINDINGS: Status post excision of the right breast. The radioactive seed and
ribbon shaped biopsy marker clip are present, completely intact, and
were marked for pathology.
IMPRESSION: Specimen radiograph of the right breast.

## 2022-01-21 ENCOUNTER — Telehealth: Payer: Self-pay | Admitting: *Deleted

## 2022-01-21 NOTE — Telephone Encounter (Signed)
Left message on voicemail to call office.  

## 2022-01-27 NOTE — Telephone Encounter (Signed)
Pt called back, said I called cause we got refill requests from the pharmacy and I denied them due to we have not seen you in over a year. Pt said yes, she moved to [place] and her son has made an appt for her to see a provider there, but if she does not like the provider she will give [place] a call. Told her okay, we are here if you need [place] just let us know. Pt verbalized understanding.

## 2022-03-07 DIAGNOSIS — Z1283 Encounter for screening for malignant neoplasm of skin: Secondary | ICD-10-CM | POA: Diagnosis not present

## 2022-03-07 DIAGNOSIS — L570 Actinic keratosis: Secondary | ICD-10-CM | POA: Diagnosis not present

## 2022-03-07 DIAGNOSIS — D225 Melanocytic nevi of trunk: Secondary | ICD-10-CM | POA: Diagnosis not present

## 2022-03-07 DIAGNOSIS — X32XXXD Exposure to sunlight, subsequent encounter: Secondary | ICD-10-CM | POA: Diagnosis not present

## 2022-03-10 ENCOUNTER — Other Ambulatory Visit: Payer: Self-pay | Admitting: Obstetrics and Gynecology

## 2022-03-10 DIAGNOSIS — N644 Mastodynia: Secondary | ICD-10-CM | POA: Diagnosis not present

## 2022-03-10 DIAGNOSIS — N63 Unspecified lump in unspecified breast: Secondary | ICD-10-CM

## 2022-03-20 ENCOUNTER — Ambulatory Visit
Admission: RE | Admit: 2022-03-20 | Discharge: 2022-03-20 | Disposition: A | Discharge status: Home / Self Care | Payer: BC Managed Care – PPO | Source: Ambulatory Visit | Attending: Obstetrics and Gynecology | Admitting: Obstetrics and Gynecology

## 2022-03-20 ENCOUNTER — Other Ambulatory Visit: Payer: Self-pay | Admitting: Obstetrics and Gynecology

## 2022-03-20 DIAGNOSIS — N644 Mastodynia: Secondary | ICD-10-CM | POA: Diagnosis not present

## 2022-03-20 DIAGNOSIS — N63 Unspecified lump in unspecified breast: Secondary | ICD-10-CM

## 2022-04-15 DIAGNOSIS — Z1151 Encounter for screening for human papillomavirus (HPV): Secondary | ICD-10-CM | POA: Diagnosis not present

## 2022-04-15 DIAGNOSIS — Z6833 Body mass index (BMI) 33.0-33.9, adult: Secondary | ICD-10-CM | POA: Diagnosis not present

## 2022-04-15 DIAGNOSIS — Z1272 Encounter for screening for malignant neoplasm of vagina: Secondary | ICD-10-CM | POA: Diagnosis not present

## 2022-04-15 DIAGNOSIS — Z01419 Encounter for gynecological examination (general) (routine) without abnormal findings: Secondary | ICD-10-CM | POA: Diagnosis not present

## 2022-04-21 DIAGNOSIS — H02423 Myogenic ptosis of bilateral eyelids: Secondary | ICD-10-CM | POA: Diagnosis not present

## 2022-04-21 DIAGNOSIS — H2513 Age-related nuclear cataract, bilateral: Secondary | ICD-10-CM | POA: Diagnosis not present

## 2022-04-21 DIAGNOSIS — H0288A Meibomian gland dysfunction right eye, upper and lower eyelids: Secondary | ICD-10-CM | POA: Diagnosis not present

## 2022-04-21 DIAGNOSIS — E119 Type 2 diabetes mellitus without complications: Secondary | ICD-10-CM | POA: Diagnosis not present

## 2022-08-01 IMAGING — US US ABDOMEN LIMITED
1 series · 14 of 25 positions shown · non-contrast
Comparison: None.

CLINICAL DATA: Palpable abnormality left lower back for 1 year

EXAM:
ULTRASOUND ABDOMEN LIMITED

[Series 1: us abdomen limited · 0.06mm/px · 29 acquisitions, 14 frames shown]
[im 1/29]
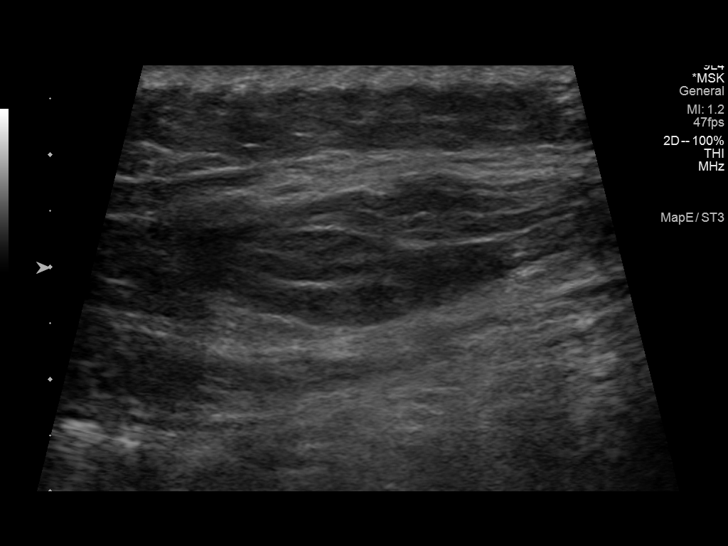
[im 3/29]
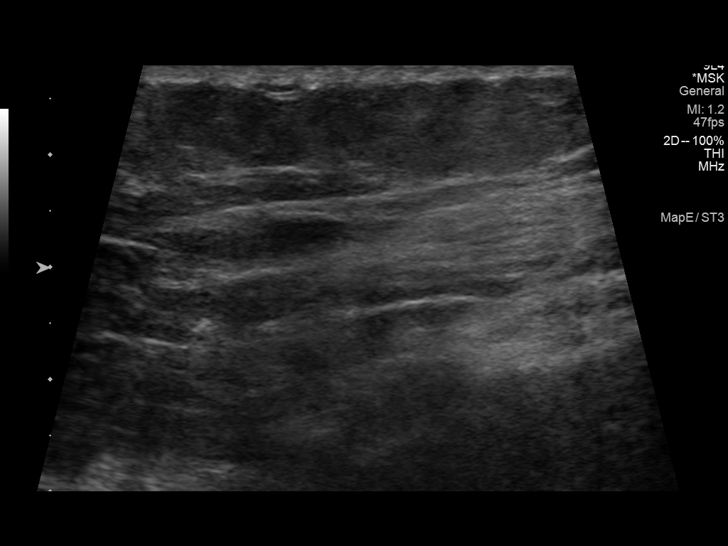
[im 5/29]
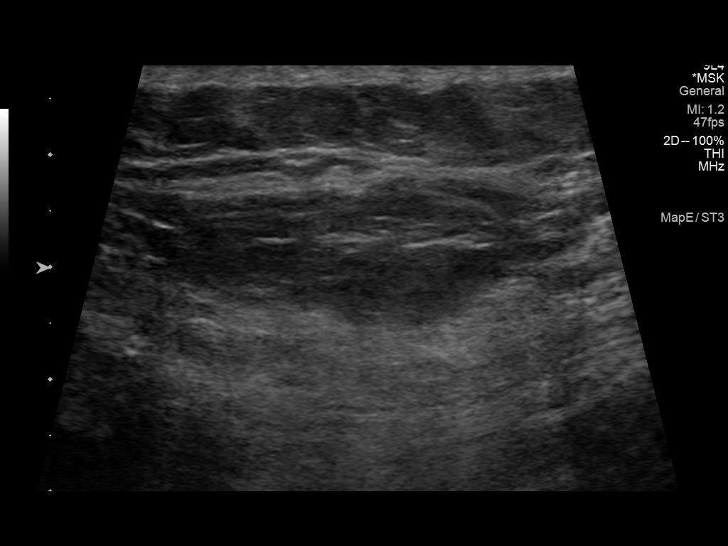
[im 8/29]
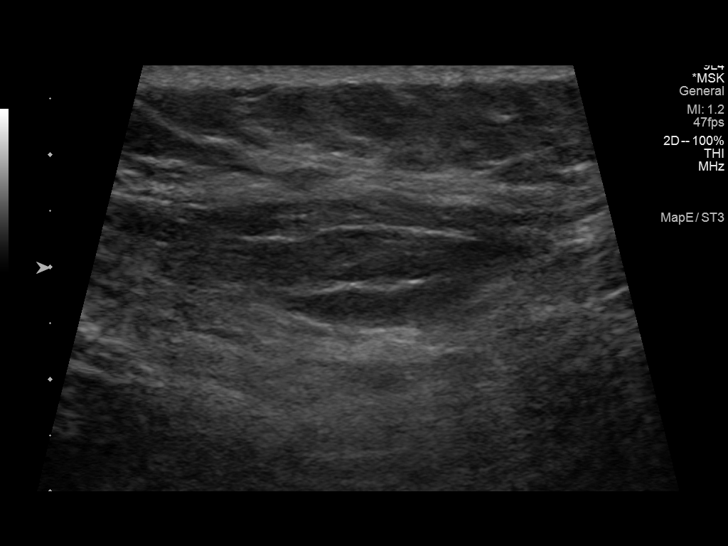
[im 10/29]
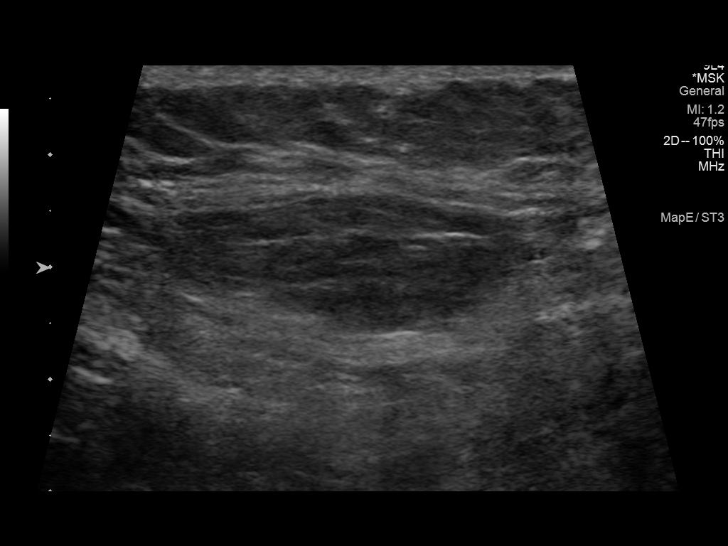
[im 11/29]
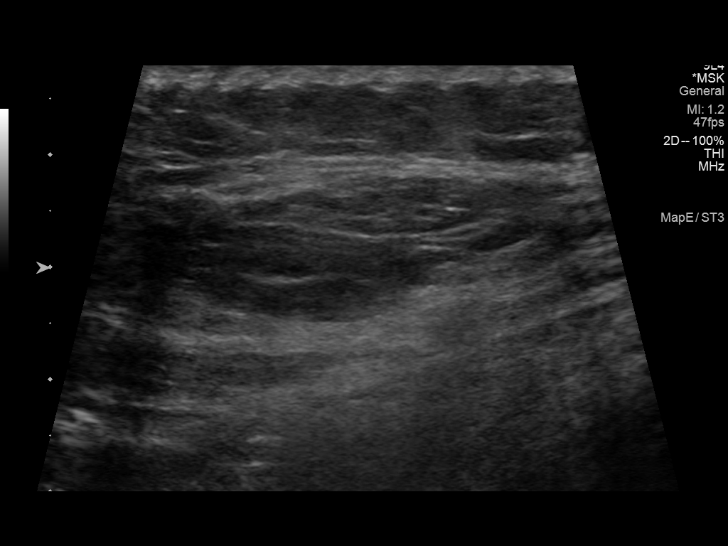
[im 13/29]
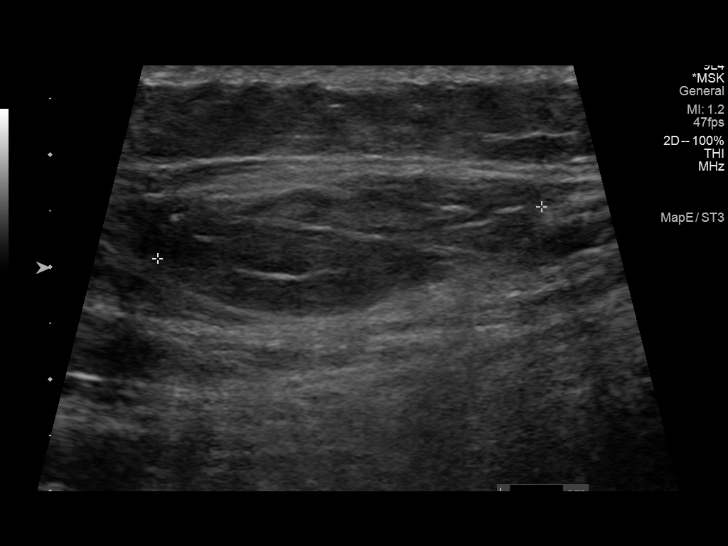
[im 16/29]
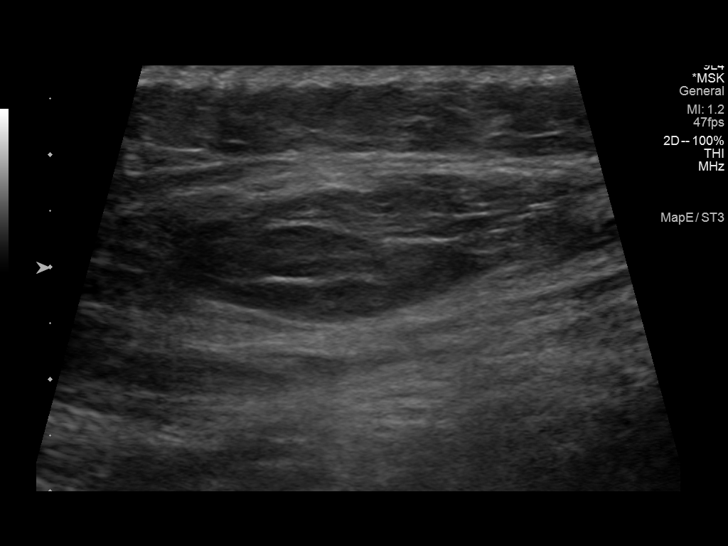
[im 18/29]
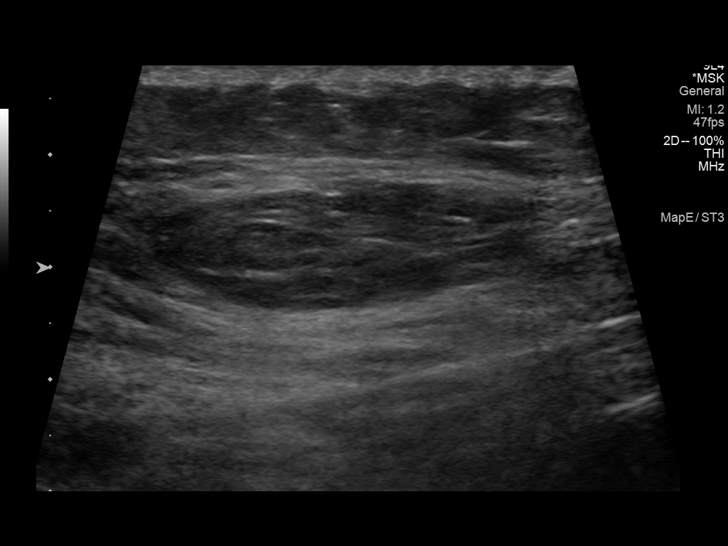
[im 19/29]
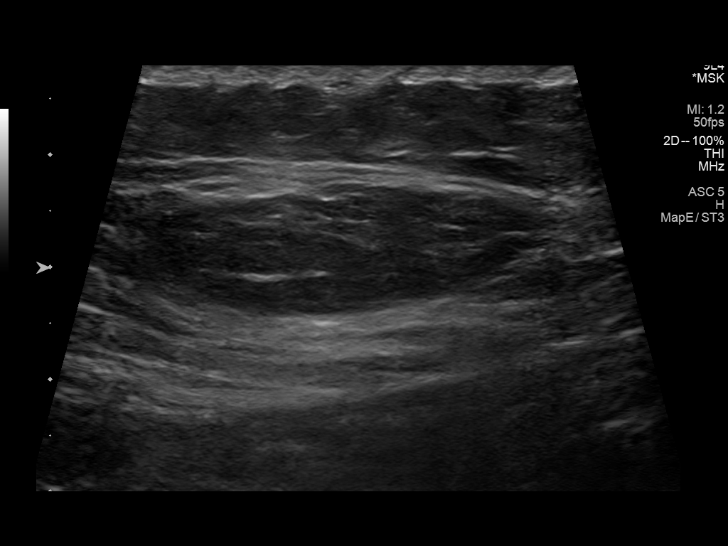
[im 22/29]
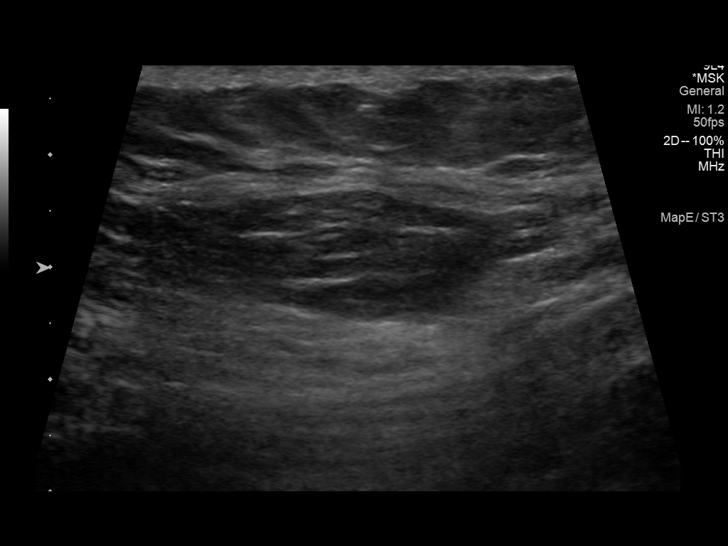
[im 24/29]
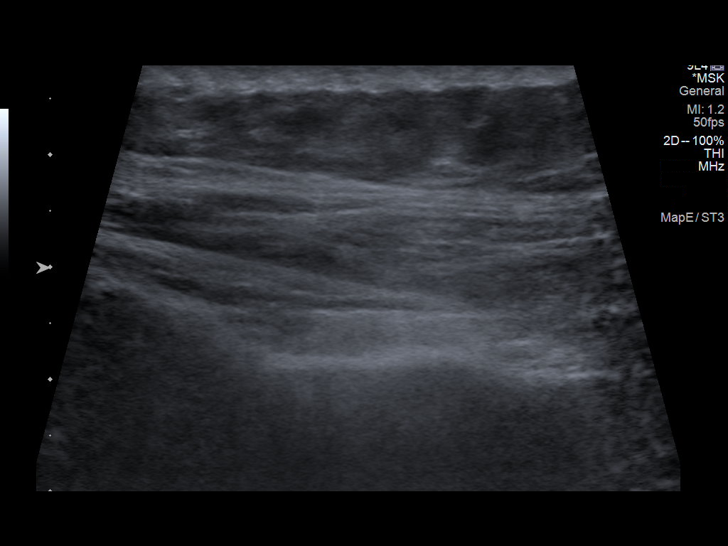
[im 26/29]
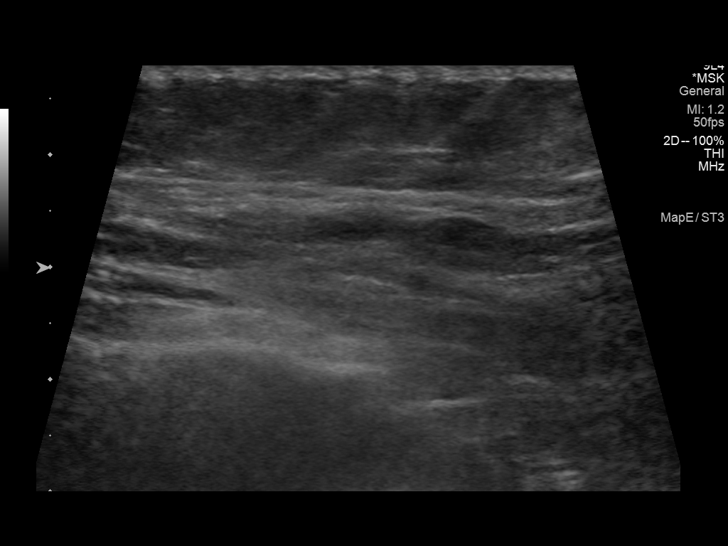
[im 29/29]
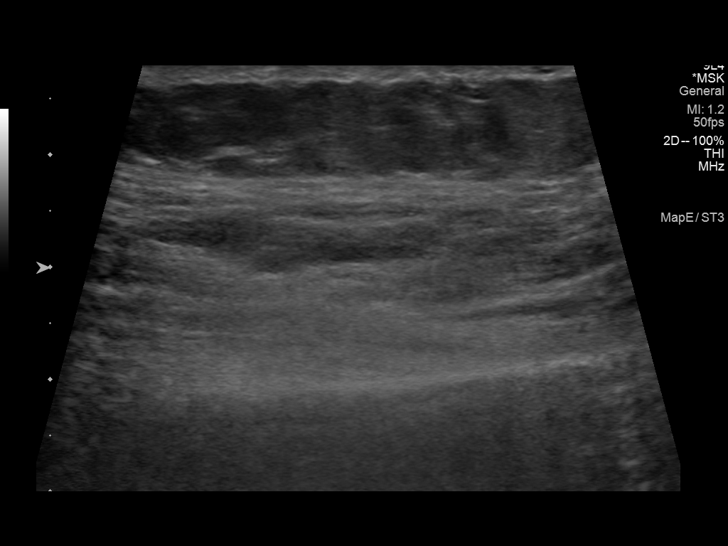

[14 of 25 positions shown; findings below may reference images not displayed]

FINDINGS: Sonographic evaluation of the palpable area in the left lower back
was performed. Deep to the subcutaneous fat, and contiguous with the
paraspinous musculature, there is a 3.5 by 3.4 x 1.3 cm region. This
may reflect focal muscular prominence. There is no corresponding
area on the contralateral lower back.
IMPRESSION: 1. Indeterminate soft tissue mass in the left lower back. This
region is deep to the subcutaneous fat, and may be contiguous with
the paraspinous musculature. If further evaluation is desired, MRI
could be performed.

## 2022-10-14 ENCOUNTER — Other Ambulatory Visit: Payer: Self-pay

## 2022-10-14 ENCOUNTER — Encounter (HOSPITAL_BASED_OUTPATIENT_CLINIC_OR_DEPARTMENT_OTHER): Payer: Self-pay | Admitting: Emergency Medicine

## 2022-10-14 ENCOUNTER — Emergency Department (HOSPITAL_BASED_OUTPATIENT_CLINIC_OR_DEPARTMENT_OTHER): Payer: BC Managed Care – PPO

## 2022-10-14 ENCOUNTER — Emergency Department (HOSPITAL_BASED_OUTPATIENT_CLINIC_OR_DEPARTMENT_OTHER)
Admission: EM | Admit: 2022-10-14 | Discharge: 2022-10-15 | Disposition: A | Discharge status: Home / Self Care | Payer: BC Managed Care – PPO | Attending: Emergency Medicine | Admitting: Emergency Medicine

## 2022-10-14 DIAGNOSIS — R079 Chest pain, unspecified: Secondary | ICD-10-CM | POA: Diagnosis not present

## 2022-10-14 DIAGNOSIS — R072 Precordial pain: Secondary | ICD-10-CM | POA: Insufficient documentation

## 2022-10-14 DIAGNOSIS — R0789 Other chest pain: Secondary | ICD-10-CM | POA: Diagnosis not present

## 2022-10-14 DIAGNOSIS — E1165 Type 2 diabetes mellitus with hyperglycemia: Secondary | ICD-10-CM | POA: Insufficient documentation

## 2022-10-14 DIAGNOSIS — Z7984 Long term (current) use of oral hypoglycemic drugs: Secondary | ICD-10-CM | POA: Diagnosis not present

## 2022-10-14 DIAGNOSIS — R739 Hyperglycemia, unspecified: Secondary | ICD-10-CM | POA: Diagnosis present

## 2022-10-14 MED ORDER — ALUM & MAG HYDROXIDE-SIMETH 200-200-20 MG/5ML PO SUSP
30.0000 mL | Freq: Once | ORAL | Status: AC
  Administered 2022-10-15: 30 mL via ORAL
  Filled 2022-10-14: qty 30

## 2022-10-14 NOTE — ED Triage Notes (Signed)
Pt reports having left sided chest pain since 1700 tonight, also c/o dizziness, ShoB, lightheadedness, and nausea; denies diaphoresis or vomiting; reports hx of angina, also reports freq burping

## 2022-10-15 ENCOUNTER — Encounter (HOSPITAL_BASED_OUTPATIENT_CLINIC_OR_DEPARTMENT_OTHER): Payer: Self-pay | Admitting: Emergency Medicine

## 2022-10-15 LAB — CBC WITH DIFFERENTIAL/PLATELET
Abs Immature Granulocytes: 0.01 10*3/uL (ref 0.00–0.07)
Basophils Absolute: 0.1 10*3/uL (ref 0.0–0.1)
Basophils Relative: 1 %
Eosinophils Absolute: 0.2 10*3/uL (ref 0.0–0.5)
Eosinophils Relative: 2 %
HCT: 41.6 % (ref 36.0–46.0)
Hemoglobin: 14.4 g/dL (ref 12.0–15.0)
Immature Granulocytes: 0 %
Lymphocytes Relative: 44 %
Lymphs Abs: 4 10*3/uL (ref 0.7–4.0)
MCH: 30.5 pg (ref 26.0–34.0)
MCHC: 34.6 g/dL (ref 30.0–36.0)
MCV: 88.1 fL (ref 80.0–100.0)
Monocytes Absolute: 0.8 10*3/uL (ref 0.1–1.0)
Monocytes Relative: 9 %
Neutro Abs: 4 10*3/uL (ref 1.7–7.7)
Neutrophils Relative %: 44 %
Platelets: 284 10*3/uL (ref 150–400)
RBC: 4.72 MIL/uL (ref 3.87–5.11)
RDW: 12.4 % (ref 11.5–15.5)
WBC: 9.1 10*3/uL (ref 4.0–10.5)
nRBC: 0 % (ref 0.0–0.2)

## 2022-10-15 LAB — BASIC METABOLIC PANEL
Anion gap: 8 (ref 5–15)
BUN: 14 mg/dL (ref 8–23)
CO2: 24 mmol/L (ref 22–32)
Calcium: 9.1 mg/dL (ref 8.9–10.3)
Chloride: 99 mmol/L (ref 98–111)
Creatinine, Ser: 0.72 mg/dL (ref 0.44–1.00)
GFR, Estimated: >60 mL/min (ref >60)
Glucose, Bld: 451 mg/dL — ABNORMAL HIGH (ref 70–99)
Potassium: 4.5 mmol/L (ref 3.5–5.1)
Sodium: 131 mmol/L — ABNORMAL LOW (ref 135–145)

## 2022-10-15 LAB — CBG MONITORING, ED
Glucose-Capillary: 406 mg/dL — ABNORMAL HIGH (ref 70–99)
Glucose-Capillary: 348 mg/dL — ABNORMAL HIGH (ref 70–99)

## 2022-10-15 LAB — TROPONIN I (HIGH SENSITIVITY)
Troponin I (High Sensitivity): 2 ng/L (ref <18)
Troponin I (High Sensitivity): 3 ng/L (ref <18)

## 2022-10-15 MED ORDER — FAMOTIDINE IN NACL 20-0.9 MG/50ML-% IV SOLN
20.0000 mg | Freq: Once | INTRAVENOUS | Status: AC
  Administered 2022-10-15: 20 mg via INTRAVENOUS
  Filled 2022-10-15: qty 50

## 2022-10-15 MED ORDER — KETOROLAC TROMETHAMINE 30 MG/ML IJ SOLN
30.0000 mg | Freq: Once | INTRAMUSCULAR | Status: AC
  Administered 2022-10-15: 30 mg via INTRAVENOUS
  Filled 2022-10-15: qty 1

## 2022-10-15 MED ORDER — INSULIN REGULAR HUMAN 100 UNIT/ML IJ SOLN: 4.0000 [IU] | Freq: Once | INTRAMUSCULAR | Status: DC

## 2022-10-15 MED ORDER — METFORMIN HCL 500 MG PO TABS
500.0000 mg | ORAL_TABLET | Freq: Once | ORAL | Status: AC
  Administered 2022-10-15: 500 mg via ORAL
  Filled 2022-10-15: qty 1

## 2022-10-15 MED ORDER — INSULIN ASPART 100 UNIT/ML IJ SOLN
5.0000 [IU] | Freq: Once | INTRAMUSCULAR | Status: AC
  Administered 2022-10-15: 5 [IU] via SUBCUTANEOUS

## 2022-10-15 MED ORDER — SODIUM CHLORIDE 0.9 % IV BOLUS
500.0000 mL | Freq: Once | INTRAVENOUS | Status: AC
  Administered 2022-10-15: 500 mL via INTRAVENOUS

## 2022-10-15 MED ORDER — METFORMIN HCL 850 MG PO TABS: 850.0000 mg | ORAL_TABLET | Freq: Two times a day (BID) | ORAL | 0 refills | Status: AC

## 2022-10-15 MED ORDER — INSULIN ASPART 100 UNIT/ML IJ SOLN
4.0000 [IU] | Freq: Once | INTRAMUSCULAR | Status: AC
  Administered 2022-10-15: 4 [IU] via SUBCUTANEOUS

## 2022-10-15 NOTE — ED Provider Notes (Signed)
[place] [facility] Provider Note   CSN: [id number] Arrival date & time: 10/14/22  2334     History  Chief Complaint  Patient presents with   Chest Pain    Allison Dillon is a 62 y.o. female.  The history is provided by the patient.  Chest Pain Pain location:  L chest and L lateral chest Pain quality: dull   Pain radiates to:  Does not radiate Pain severity:  Moderate Onset quality:  Gradual Duration:  8 hours Timing:  Constant Progression:  Unchanged Chronicity:  New Context: at rest   Context comment:  Ate a black bean burger earlier and had a lot of belching associated with that Relieved by:  Nothing Worsened by:  Nothing Ineffective treatments:  None tried Associated symptoms: no anorexia, no back pain, no fever, no lower extremity edema, no orthopnea, no palpitations, no syncope and no vomiting   Risk factors: diabetes mellitus   Patient with diabetes presents with L lateral chest discomfort at rest.  No exertiona pain, no DOE.  No n/v/d.  No back neck or shoulder pain.  Had a lot of belching this evening post eating a black bean burger.  Has not had her metformin in over a month and glucose was elevated in the 300s at home tonight.       Home Medications Prior to Admission medications   Medication Sig Start Date End Date Taking? Authorizing Provider  metFORMIN (GLUCOPHAGE) 850 MG tablet Take 1 tablet (850 mg total) by mouth 2 (two) times daily with a meal. 10/15/22  Yes Adams Hinch, MD  cholecalciferol (VITAMIN D3) 25 MCG (1000 UT) tablet Take 1,000 Units by mouth daily.    [provider]  Cyanocobalamin (VITAMIN B-12) 2500 MCG SUBL Place 1 tablet under the tongue daily.    [provider]  ONETOUCH VERIO test strip USE TO TEST BLOOD SUGARS DAILY. DX: E11.9 09/10/21   Jarold Motto, PA      Allergies    Contrast media [iodinated contrast media], Penicillins, and Iodine    Review of Systems    Review of Systems  Constitutional:  Negative for fever.  HENT:  Negative for facial swelling.   Eyes:  Negative for redness.  Cardiovascular:  Positive for chest pain. Negative for palpitations, orthopnea and syncope.  Gastrointestinal:  Negative for anorexia and vomiting.  Musculoskeletal:  Negative for back pain.  All other systems reviewed and are negative.   Physical Exam Updated Vital Signs BP 134/71   Pulse 70   Temp 98.4 F (36.9 C) (Oral)   Resp 15   Ht 5\' 3"  (1.6 m)   Wt 83.9 kg   SpO2 97%   BMI 32.77 kg/m  Physical Exam Vitals and nursing note reviewed.  Constitutional:      General: She is not in acute distress.    Appearance: Normal appearance. She is well-developed. She is not diaphoretic.  HENT:     Head: Normocephalic and atraumatic.     Nose: Nose normal.  Eyes:     Pupils: Pupils are equal, round, and reactive to light.  Cardiovascular:     Rate and Rhythm: Normal rate and regular rhythm.     Pulses: Normal pulses.     Heart sounds: Normal heart sounds.  Pulmonary:     Effort: Pulmonary effort is normal. No respiratory distress.     Breath sounds: Normal breath sounds.  Abdominal:     General: There is no distension.  Palpations: Abdomen is soft.     Tenderness: There is no abdominal tenderness. There is no guarding or rebound.     Comments: Increased BS throughout   Genitourinary:    Vagina: No vaginal discharge.  Musculoskeletal:        General: Normal range of motion.     Cervical back: Normal range of motion and neck supple.  Skin:    General: Skin is warm and dry.     Capillary Refill: Capillary refill takes less than 2 seconds.     Findings: No erythema or rash.  Neurological:     General: No focal deficit present.     Mental Status: She is alert and oriented to person, place, and time.     Deep Tendon Reflexes: Reflexes normal.  Psychiatric:        Mood and Affect: Mood normal.     ED Results / Procedures / Treatments    Labs (all labs ordered are listed, but only abnormal results are displayed) Results for orders placed or performed during the hospital encounter of 10/14/22  CBC with Differential  Result Value Ref Range   WBC 9.1 4.0 - 10.5 K/uL   RBC 4.72 3.87 - 5.11 MIL/uL   Hemoglobin 14.4 12.0 - 15.0 g/dL   HCT 16.1 09.6 - 04.5 %   MCV 88.1 80.0 - 100.0 fL   MCH 30.5 26.0 - 34.0 pg   MCHC 34.6 30.0 - 36.0 g/dL   RDW 40.9 81.1 - 91.4 %   Platelets 284 150 - 400 K/uL   nRBC 0.0 0.0 - 0.2 %   Neutrophils Relative % 44 %   Neutro Abs 4.0 1.7 - 7.7 K/uL   Lymphocytes Relative 44 %   Lymphs Abs 4.0 0.7 - 4.0 K/uL   Monocytes Relative 9 %   Monocytes Absolute 0.8 0.1 - 1.0 K/uL   Eosinophils Relative 2 %   Eosinophils Absolute 0.2 0.0 - 0.5 K/uL   Basophils Relative 1 %   Basophils Absolute 0.1 0.0 - 0.1 K/uL   Immature Granulocytes 0 %   Abs Immature Granulocytes 0.01 0.00 - 0.07 K/uL  Basic metabolic panel  Result Value Ref Range   Sodium 131 (L) 135 - 145 mmol/L   Potassium 4.5 3.5 - 5.1 mmol/L   Chloride 99 98 - 111 mmol/L   CO2 24 22 - 32 mmol/L   Glucose, Bld 451 (H) 70 - 99 mg/dL   BUN 14 8 - 23 mg/dL   Creatinine, Ser 7.82 0.44 - 1.00 mg/dL   Calcium 9.1 8.9 - 95.6 mg/dL   GFR, Estimated >21 >30 mL/min   Anion gap 8 5 - 15  CBG monitoring, ED  Result Value Ref Range   Glucose-Capillary 406 (H) 70 - 99 mg/dL  CBG monitoring, ED  Result Value Ref Range   Glucose-Capillary 348 (H) 70 - 99 mg/dL  Troponin I (High Sensitivity)  Result Value Ref Range   Troponin I (High Sensitivity) 3 <18 ng/L  Troponin I (High Sensitivity)  Result Value Ref Range   Troponin I (High Sensitivity) 2 <18 ng/L   DG Chest Portable 1 View  Result Date: 10/14/2022 CLINICAL DATA:  Left-sided chest pain. EXAM: PORTABLE CHEST 1 VIEW COMPARISON:  None Available. FINDINGS: The heart size and mediastinal contours are within normal limits. Both lungs are clear. The visualized skeletal structures are  unremarkable. IMPRESSION: No active disease. Electronically Signed   By: Aram Candela M.D.   On: 10/14/2022 23:53  EKG EKG Interpretation  Date/Time:  Tuesday October 14 2022 23:43:00 EDT Ventricular Rate:  92 PR Interval:  146 QRS Duration: 90 QT Interval:  357 QTC Calculation: 442 R Axis:   56 Text Interpretation: Sinus rhythm Low voltage, precordial leads Confirmed by Nicanor Alcon, Kryssa Risenhoover ([id number]) on 10/15/2022 12:08:26 AM  Radiology DG Chest Portable 1 View  Result Date: 10/14/2022 CLINICAL DATA:  Left-sided chest pain. EXAM: PORTABLE CHEST 1 VIEW COMPARISON:  None Available. FINDINGS: The heart size and mediastinal contours are within normal limits. Both lungs are clear. The visualized skeletal structures are unremarkable. IMPRESSION: No active disease. Electronically Signed   By: Aram Candela M.D.   On: 10/14/2022 23:53    Procedures Procedures    Medications Ordered in ED Medications  alum & mag hydroxide-simeth (MAALOX/MYLANTA) 200-200-20 MG/5ML suspension 30 mL (30 mLs Oral Given 10/15/22 0006)  sodium chloride 0.9 % bolus 500 mL (0 mLs Intravenous Stopped 10/15/22 0127)  famotidine (PEPCID) IVPB 20 mg premix (0 mg Intravenous Stopped 10/15/22 0118)  insulin aspart (novoLOG) injection 5 Units (5 Units Subcutaneous Given 10/15/22 0038)  ketorolac (TORADOL) 30 MG/ML injection 30 mg (30 mg Intravenous Given 10/15/22 0114)  metFORMIN (GLUCOPHAGE) tablet 500 mg (500 mg Oral Given 10/15/22 0137)  insulin aspart (novoLOG) injection 4 Units (4 Units Subcutaneous Given 10/15/22 0137)    ED Course/ Medical Decision Making/ A&P                             Medical Decision Making Patient with L lateral dull chest pain at rest.    Problems Addressed: Hyperglycemia:    Details: Have restarted patient's metformin and advised low carb diet and close follow up with community health and wellness  Precordial pain:    Details: Likely secondary to gerd.  Treated in the ED  Amount and/or  Complexity of Data Reviewed Independent Historian: spouse    Details: See above  External Data Reviewed: ECG and notes.    Details: Previous notes reviewed  Labs: ordered.    Details: 2 negative troponins 3/2.  Improving glucose. Normal white count 9.1, normal hemoglobin 14.1, normal platelet count.  Sodium 131 but corrects to normal with glucose, normal potassium 4.5 normal creatinine .72, glucose elevated 451, then 406, then 348 with treatment.   Radiology: ordered and independent interpretation performed.    Details: Negative CXR by me  ECG/medicine tests: ordered and independent interpretation performed. Decision-making details documented in ED Course.  Risk OTC drugs. Prescription drug management. Risk Details: Insulin given along with metformin.  Ruled out for MI in the ED heart score is 2 low risk for MACE. Symptoms are consistent with GERD.  I have provided clinic name and numbner for follow up to ensure patient can continue medication.  Stable for discharge with close follow up.  Strict return    Final Clinical Impression(s) / ED Diagnoses Final diagnoses:  Hyperglycemia  Precordial pain   Return for intractable cough, coughing up blood, fevers > 100.4 unrelieved by medication, shortness of breath, intractable vomiting, chest pain, shortness of breath, weakness, numbness, changes in speech, facial asymmetry, abdominal pain, passing out, Inability to tolerate liquids or food, cough, altered mental status or any concerns. No signs of systemic illness or infection. The patient is nontoxic-appearing on exam and vital signs are within normal limits.  I have reviewed the triage vital signs and the nursing notes. Pertinent labs & imaging results that were available during my care of  the patient were reviewed by me and considered in my medical decision making (see chart for details). After history, exam, and medical workup I feel the patient has been appropriately medically screened and is  safe for discharge home. Pertinent diagnoses were discussed with the patient. Patient was given return precautions Rx / DC Orders ED Discharge Orders          Ordered    metFORMIN (GLUCOPHAGE) 850 MG tablet  2 times daily with meals        10/15/22 0127              Dymphna Wadley, MD 10/15/22 4098

## 2022-10-27 ENCOUNTER — Other Ambulatory Visit: Payer: Self-pay | Admitting: Family Medicine

## 2022-10-27 DIAGNOSIS — E785 Hyperlipidemia, unspecified: Secondary | ICD-10-CM

## 2022-11-26 ENCOUNTER — Other Ambulatory Visit: Payer: BC Managed Care – PPO

## 2022-12-05 ENCOUNTER — Other Ambulatory Visit: Payer: Self-pay | Admitting: Family Medicine

## 2022-12-05 ENCOUNTER — Encounter: Payer: Self-pay | Admitting: Family Medicine

## 2022-12-05 DIAGNOSIS — G8929 Other chronic pain: Secondary | ICD-10-CM

## 2022-12-08 ENCOUNTER — Ambulatory Visit
Admission: RE | Admit: 2022-12-08 | Discharge: 2022-12-08 | Disposition: A | Discharge status: Home / Self Care | Payer: BC Managed Care – PPO | Source: Ambulatory Visit | Attending: Family Medicine | Admitting: Family Medicine

## 2022-12-08 DIAGNOSIS — G8929 Other chronic pain: Secondary | ICD-10-CM

## 2022-12-08 DIAGNOSIS — I700 Atherosclerosis of aorta: Secondary | ICD-10-CM | POA: Diagnosis not present

## 2022-12-08 DIAGNOSIS — R1011 Right upper quadrant pain: Secondary | ICD-10-CM | POA: Diagnosis not present

## 2022-12-16 ENCOUNTER — Other Ambulatory Visit: Payer: BC Managed Care – PPO

## 2022-12-17 ENCOUNTER — Other Ambulatory Visit: Payer: BC Managed Care – PPO

## 2023-02-09 ENCOUNTER — Other Ambulatory Visit: Payer: Self-pay | Admitting: Physician Assistant

## 2023-05-01 DIAGNOSIS — H2513 Age-related nuclear cataract, bilateral: Secondary | ICD-10-CM | POA: Diagnosis not present

## 2023-05-01 DIAGNOSIS — H0288A Meibomian gland dysfunction right eye, upper and lower eyelids: Secondary | ICD-10-CM | POA: Diagnosis not present

## 2023-05-01 DIAGNOSIS — E119 Type 2 diabetes mellitus without complications: Secondary | ICD-10-CM | POA: Diagnosis not present

## 2023-05-01 DIAGNOSIS — H0288B Meibomian gland dysfunction left eye, upper and lower eyelids: Secondary | ICD-10-CM | POA: Diagnosis not present

## 2023-05-01 LAB — HM DIABETES EYE EXAM
